# Patient Record
Sex: Female | Born: 1995 | Race: White | Hispanic: No | Marital: Single | Smoking: Current every day smoker
Health system: Southern US, Community
[De-identification: ages and names within clinical notes are randomized; demographics above are authoritative.]

## PROBLEM LIST (undated history)

## (undated) DIAGNOSIS — M545 Low back pain, unspecified: Secondary | ICD-10-CM

## (undated) DIAGNOSIS — F329 Major depressive disorder, single episode, unspecified: Secondary | ICD-10-CM

## (undated) DIAGNOSIS — F32A Depression, unspecified: Secondary | ICD-10-CM

## (undated) DIAGNOSIS — M4310 Spondylolisthesis, site unspecified: Secondary | ICD-10-CM

## (undated) DIAGNOSIS — M4317 Spondylolisthesis, lumbosacral region: Secondary | ICD-10-CM

## (undated) DIAGNOSIS — F419 Anxiety disorder, unspecified: Secondary | ICD-10-CM

## (undated) HISTORY — DX: Spondylolisthesis, lumbosacral region: M43.17

## (undated) HISTORY — PX: OTHER SURGICAL HISTORY: SHX169

## (undated) HISTORY — DX: Low back pain, unspecified: M54.50

---
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
## 2014-01-20 ENCOUNTER — Encounter (HOSPITAL_COMMUNITY): Payer: Self-pay | Admitting: Emergency Medicine

## 2014-01-20 ENCOUNTER — Emergency Department (HOSPITAL_COMMUNITY)
Admission: EM | Admit: 2014-01-20 | Discharge: 2014-01-20 | Disposition: A | Discharge status: Home / Self Care | Payer: BC Managed Care – PPO | Attending: Emergency Medicine | Admitting: Emergency Medicine

## 2014-01-20 DIAGNOSIS — F17200 Nicotine dependence, unspecified, uncomplicated: Secondary | ICD-10-CM | POA: Insufficient documentation

## 2014-01-20 DIAGNOSIS — Y9389 Activity, other specified: Secondary | ICD-10-CM | POA: Insufficient documentation

## 2014-01-20 DIAGNOSIS — T169XXA Foreign body in ear, unspecified ear, initial encounter: Secondary | ICD-10-CM | POA: Insufficient documentation

## 2014-01-20 DIAGNOSIS — Y929 Unspecified place or not applicable: Secondary | ICD-10-CM | POA: Insufficient documentation

## 2014-01-20 DIAGNOSIS — Z8659 Personal history of other mental and behavioral disorders: Secondary | ICD-10-CM | POA: Insufficient documentation

## 2014-01-20 DIAGNOSIS — IMO0002 Reserved for concepts with insufficient information to code with codable children: Secondary | ICD-10-CM | POA: Insufficient documentation

## 2014-01-20 HISTORY — DX: Anxiety disorder, unspecified: F41.9

## 2014-01-20 MED ORDER — ANTIPYRINE-BENZOCAINE 5.4-1.4 % OT SOLN: 3.0000 [drp] | OTIC | Status: DC | PRN

## 2014-01-20 NOTE — ED Notes (Signed)
Pt states she awoke just before arrival with crawling sensation in L ear, pt states she could feel movement with associated pain. Visual inspection of ear with otoscope revealed insect alive moving in ear canal.

## 2014-01-20 NOTE — ED Provider Notes (Signed)
CSN: [id number]     Arrival date & time 01/20/14  0509 History   First MD Initiated Contact with Patient 01/20/14 0510     Chief Complaint  Patient presents with  . Foreign Body in Ear     (Consider location/radiation/quality/duration/timing/severity/associated sxs/prior Treatment) HPI Comments: Pt comes in with cc of ear pain. Reports waking up in the middle of the night and noticing crawling sensation to the left ear. Pt examined by RN at triage, and she could see an insect in the ear canal. Pt's pain increased with the RN eval. No other complains. No drainage from the ear.  Patient is a 18 y.o. female presenting with foreign body in ear. The history is provided by the patient.  Foreign Body in Ear Pertinent negatives include no headaches.    Past Medical History  Diagnosis Date  . Anxiety    Past Surgical History  Procedure Laterality Date  . Wisdom     No family history on file. History  Substance Use Topics  . Smoking status: Current Some Day Smoker  . Smokeless tobacco: Not on file  . Alcohol Use: Yes     Comment: occ   OB History   Grav Para Term Preterm Abortions TAB SAB Ect Mult Living                 Review of Systems  HENT: Positive for ear pain. Negative for ear discharge.   Eyes: Negative for visual disturbance.  Gastrointestinal: Negative for nausea and vomiting.  Neurological: Negative for headaches.      Allergies  Review of patient's allergies indicates no known allergies.  Home Medications   Prior to Admission medications   Medication Sig Start Date End Date Taking? Authorizing Provider  antipyrine-benzocaine Lyla Son(AURALGAN) otic solution Place 3-4 drops into the left ear every 2 (two) hours as needed for ear pain. 01/20/14   Delani Kohli Rhunette CroftNanavati, MD   BP 125/64  Pulse 93  Temp(Src) 98.2 F (36.8 C) (Oral)  Resp 16  Ht 5\' 7"  (1.702 m)  Wt 211 lb (95.709 kg)  BMI 33.04 kg/m2  SpO2 99%  LMP 12/08/2013 Physical Exam  Nursing note and vitals  reviewed. Constitutional: She appears well-developed.  HENT:  Left ear canal is clear - no insect/foreign body seen  Eyes: Conjunctivae are normal.  Neck: Neck supple.  Cardiovascular: Normal rate.   Pulmonary/Chest: Effort normal.    ED Course  Irrigation Date/Time: 01/20/2014 7:08 AM Performed by: Derwood KaplanNANAVATI, Refugio Mcconico Authorized by: Derwood KaplanNANAVATI, Shadaya Marschner Consent: Verbal consent obtained. Risks and benefits: risks, benefits and alternatives were discussed Consent given by: patient Required items: required blood products, implants, devices, and special equipment available Patient identity confirmed: verbally with patient Time out: Immediately prior to procedure a "time out" was called to verify the correct patient, procedure, equipment, support staff and site/side marked as required. Preparation: Patient was prepped and draped in the usual sterile fashion. Local anesthesia used: no Patient sedated: no Patient tolerance: Patient tolerated the procedure well with no immediate complications. Comments: Left ear irrigation for foreign body removal   (including critical care time) Labs Review Labs Reviewed - No data to display  Imaging Review No results found.   EKG Interpretation None      MDM   Final diagnoses:  Ear foreign body    Pt comes to the ER with foreign body to the left ear. RN saw the insect, and promptly applied lidocaine to the ear. Patient's pain increased initially, but then the crawling sensation  resolved. Pt was pain free by the time i saw her - and i couldn't locate the insect. Irrigated several time with saline -with no good results.  Will d.c with auralgan for her pain.  Derwood KaplanAnkit Jakyah Bradby, MD 01/20/14 [phone]

## 2014-01-20 NOTE — ED Notes (Signed)
Pt provided suction for L ear, no results at this time

## 2014-01-20 NOTE — ED Notes (Signed)
Ear flushed with warmed lidocaine then warmed saline, pt no longer feels movement. Unable to visualize insect, nothing produced with flushing

## 2014-01-20 NOTE — Discharge Instructions (Signed)
Ear Foreign Body °An ear foreign body is an object that is stuck in the ear. It is common for young children to put objects into the ear canal. These may include pebbles, beads, beans, and any other small objects which will fit. In adults, objects such as cotton swabs may become lodged in the ear canal. In all ages, the most common foreign bodies are insects that enter the ear canal.  °SYMPTOMS  °Foreign bodies may cause pain, buzzing or roaring sounds, hearing loss, and ear drainage.  °HOME CARE INSTRUCTIONS  °· Keep all follow-up appointments with your caregiver as told. °· Keep small objects out of reach of young children. Tell them not to put anything in their ears. °SEEK IMMEDIATE MEDICAL CARE IF:  °· You have bleeding from the ear. °· You have increased pain or swelling of the ear. °· You have reduced hearing. °· You have discharge coming from the ear. °· You have a fever. °· You have a headache. °MAKE SURE YOU:  °· Understand these instructions. °· Will watch your condition. °· Will get help right away if you are not doing well or get worse. °Document Released: 07/24/2000 Document Revised: 10/19/2011 Document Reviewed: 03/14/2008 °ExitCare® Patient Information ©2014 [employer]. ° °

## 2016-05-07 ENCOUNTER — Ambulatory Visit (INDEPENDENT_AMBULATORY_CARE_PROVIDER_SITE_OTHER): Payer: BLUE CROSS/BLUE SHIELD | Admitting: Family Medicine

## 2016-05-07 ENCOUNTER — Ambulatory Visit (INDEPENDENT_AMBULATORY_CARE_PROVIDER_SITE_OTHER): Payer: BLUE CROSS/BLUE SHIELD

## 2016-05-07 VITALS — BP 118/70 | HR 81 | Temp 98.1°F | Resp 16 | Ht 67.25 in | Wt 232.6 lb

## 2016-05-07 DIAGNOSIS — R059 Cough, unspecified: Secondary | ICD-10-CM

## 2016-05-07 DIAGNOSIS — R05 Cough: Secondary | ICD-10-CM

## 2016-05-07 DIAGNOSIS — R509 Fever, unspecified: Secondary | ICD-10-CM

## 2016-05-07 DIAGNOSIS — J069 Acute upper respiratory infection, unspecified: Secondary | ICD-10-CM | POA: Diagnosis not present

## 2016-05-07 MED ORDER — GUAIFENESIN-CODEINE 200-10 MG/5ML PO LIQD: 5.0000 mL | Freq: Three times a day (TID) | ORAL | 0 refills | Status: DC | PRN

## 2016-05-07 MED ORDER — AZITHROMYCIN 250 MG PO TABS: ORAL_TABLET | ORAL | 0 refills | Status: DC

## 2016-05-07 MED ORDER — PREDNISONE 10 MG (21) PO TBPK: ORAL_TABLET | ORAL | 0 refills | Status: DC

## 2016-05-07 NOTE — Patient Instructions (Addendum)
  You also have an upper respiratory illness.   I have sent to azithromycin for this. You should take 2 pills today then 1 pill daily after that for 5 days total.  I have also sent a prednisone to help quiet down the inflammation/wheezing or lungs. Take 6 pills today, 5 pills tomorrow, 4 pills today after, 3 pills after, 2 pills after, 1 pill last day.  I have prescribed you a prescription cough syrup. This has codeine in it. This may make you sleepy see do not drive with it.  I did hear some wheezing at the bottoms of your lungs. You should come back in 6-8 weeks to be evaluated for pulmonary function testing for asthma.      IF you received an x-ray today, you will receive an invoice from [facility]. Please contact [facility] at [phone] with questions or concerns regarding your invoice.   IF you received labwork today, you will receive an invoice from [facility]. Please contact [facility] at [phone] with questions or concerns regarding your invoice.   Our billing staff will not be able to assist you with questions regarding bills from these companies.  You will be contacted with the lab results as soon as they are available. The fastest way to get your results is to activate your My Chart account. Instructions are located on the last page of this paperwork. If you have not heard from us regarding the results in 2 weeks, please contact this office.

## 2016-05-07 NOTE — Progress Notes (Signed)
Laura Cooper is a 20 y.o. female who presents to [facility] today for cough and URI symptoms:  1.  Cough:  Present for past 2 weeks.  Describes runny nose, alternating productive and dry hacking cough for same period of time.  Cough is now main symptom. She has had episodes of fevers with cold chills. Describes is his inability to get warm also "pouring sweat."  Sick contacts are her boyfriend. She has had a couple of episodes of posttussive emesis. These of just bile/mucus.  She is not feeling better since symptoms started 2 weeks ago -- if anything this is getting worse.   Family history: Denies any history of lung issues. States that her mother father and all siblings are healthy.  Past history: Significant for depression and anxiety. She also does have irregular menstrual cycles. These come every 2 months or so. Last menstrual period was August 7. She just took a pregnancy test this week which was negative. She has no personal history of asthma. However she states she was given an albuterol inhaler about a year ago for bronchitis. She does not have this anymore. She did not need to use this any further after her bronchitic episode.  Social:  She denies smoking cigarettes, though both mom and boyfriend smoke cigarettes.  Endorses daily THC smoking.    ROS as above.    PMH reviewed. Patient is a nonsmoker.   Past Medical History:  Diagnosis Date  . Anxiety    Past Surgical History:  Procedure Laterality Date  . wisdom      Medications reviewed. Current Outpatient Prescriptions  Medication Sig Dispense Refill  . antipyrine-benzocaine (AURALGAN) otic solution Place 3-4 drops into the left ear every 2 (two) hours as needed for ear pain. (Patient not taking: Reported on 05/07/2016) 10 mL 0   No current facility-administered medications for this visit.      Physical Exam:  BP 118/70 (BP Location: Right Arm, Patient Position: Sitting, Cuff Size: Normal)    Pulse 81   Temp 98.1 F (36.7 C) (Oral)   Resp 16   Ht 5' 7.25" (1.708 m)   Wt 232 lb 9.6 oz (105.5 kg)   LMP 03/14/2016 (Approximate)   SpO2 99%   BMI 36.16 kg/m  Gen:  Patient sitting on exam table, appears stated age in no acute distress.  Nontoxic though does appear not to feel well. Multiple coughing episodes.  Head: Normocephalic atraumatic Eyes: EOMI, PERRL, sclera and conjunctiva non-erythematous Ears:  Canals clear bilaterally.  TMs pearly gray bilaterally without erythema or bulging.   Nose:  Nasal turbinates grossly enlarged bilaterally. Some exudates noted. Tender to palpation of maxillary sinus  Mouth: Mucosa membranes moist. Tonsils +2, nonenlarged, non-erythematous. Neck: No cervical lymphadenopathy noted Heart:  RRR, no murmurs auscultated. Pulm:  Not great air Movement with this seems secondary to poor effort as she does not want to trigger coughing. When she is able to take a deep breath she does have persistent coughing episodes. When taking a deep breath I can hear wheezing only at the bilateral bases.   CXR:  Negative for any PNA.    Assessment and Plan:  1.  URI: - with beginnings of bronchitis. - no actual diagnosis of asthma.  Plan to treat with course of abx plus prednisone.  No need for inhaler as of now.  - Also Guaifenesin for relief of cough due to persistence and severity of symptoms.    2.  Wheezing: -  needs to return in 6 - 8 weeks for PFTs to evaluate for asthma.  Likely secondary to daily THC smoking and current illness.   3.  THC use: - counseled to quit.  Discussed how this smoke can still trigger/worsen lung issues.

## 2016-08-20 ENCOUNTER — Ambulatory Visit (INDEPENDENT_AMBULATORY_CARE_PROVIDER_SITE_OTHER): Payer: BLUE CROSS/BLUE SHIELD | Admitting: Physician Assistant

## 2016-08-20 VITALS — BP 108/60 | HR 95 | Temp 99.0°F | Resp 16 | Ht 67.25 in | Wt 234.8 lb

## 2016-08-20 DIAGNOSIS — R059 Cough, unspecified: Secondary | ICD-10-CM

## 2016-08-20 DIAGNOSIS — R112 Nausea with vomiting, unspecified: Secondary | ICD-10-CM | POA: Diagnosis not present

## 2016-08-20 DIAGNOSIS — R0981 Nasal congestion: Secondary | ICD-10-CM

## 2016-08-20 DIAGNOSIS — B349 Viral infection, unspecified: Secondary | ICD-10-CM

## 2016-08-20 DIAGNOSIS — R05 Cough: Secondary | ICD-10-CM | POA: Diagnosis not present

## 2016-08-20 MED ORDER — FLUTICASONE PROPIONATE 50 MCG/ACT NA SUSP: 2.0000 | Freq: Every day | NASAL | 6 refills | Status: DC

## 2016-08-20 MED ORDER — BENZONATATE 100 MG PO CAPS: 100.0000 mg | ORAL_CAPSULE | Freq: Three times a day (TID) | ORAL | 0 refills | Status: DC | PRN

## 2016-08-20 MED ORDER — HYDROCODONE-HOMATROPINE 5-1.5 MG/5ML PO SYRP: 5.0000 mL | ORAL_SOLUTION | Freq: Three times a day (TID) | ORAL | 0 refills | Status: DC | PRN

## 2016-08-20 MED ORDER — MUCINEX DM MAXIMUM STRENGTH 60-1200 MG PO TB12: 1.0000 | ORAL_TABLET | Freq: Two times a day (BID) | ORAL | 1 refills | Status: DC

## 2016-08-20 NOTE — Patient Instructions (Addendum)
Drink at least 2 liters of water a day. Warm tea with honey. Hot showers.  Flonase: 2 sprays each nostril at night before bed and in the morning when you wake up.  Hycodan - please only take this as directed and at night before bed.    Bland Diet Introduction A bland diet consists of foods that do not have a lot of fat or fiber. Foods without fat or fiber are easier for the body to digest. They are also less likely to irritate your mouth, throat, stomach, and other parts of your gastrointestinal tract. A bland diet is sometimes called a BRAT diet. What is my plan? Your health care provider or dietitian may recommend specific changes to your diet to prevent and treat your symptoms, such as:  Eating small meals often.  Cooking food until it is soft enough to chew easily.  Chewing your food well.  Drinking fluids slowly.  Not eating foods that are very spicy, sour, or fatty.  Not eating citrus fruits, such as oranges and grapefruit. What do I need to know about this diet?  Eat a variety of foods from the bland diet food list.  Do not follow a bland diet longer than you have to.  Ask your health care provider whether you should take vitamins. What foods can I eat? Grains  Hot cereals, such as cream of wheat. Bread, crackers, or tortillas made from refined white flour. Rice. Vegetables  Canned or cooked vegetables. Mashed or boiled potatoes. Fruits  Bananas. Applesauce. Other types of cooked or canned fruit with the skin and seeds removed, such as canned peaches or pears. Meats and Other Protein Sources  Scrambled eggs. Creamy peanut butter or other nut butters. Lean, well-cooked meats, such as chicken or fish. Tofu. Soups or broths. Dairy  Low-fat dairy products, such as milk, cottage cheese, or yogurt. Beverages  Water. Herbal tea. Apple juice. Sweets and Desserts  Pudding. Custard. Fruit gelatin. Ice cream. Fats and Oils  Mild salad dressings. Canola or olive oil. The  items listed above may not be a complete list of allowed foods or beverages. Contact your dietitian for more options.  What foods are not recommended? Foods and ingredients that are often not recommended include:  Spicy foods, such as hot sauce or salsa.  Fried foods.  Sour foods, such as pickled or fermented foods.  Raw vegetables or fruits, especially citrus or berries.  Caffeinated drinks.  Alcohol.  Strongly flavored seasonings or condiments. The items listed above may not be a complete list of foods and beverages that are not allowed. Contact your dietitian for more information.  This information is not intended to replace advice given to you by your health care provider. Make sure you discuss any questions you have with your health care provider. Document Released: 11/18/2015 Document Revised: 01/02/2016 Document Reviewed: 08/08/2014  2017 Elsevier  IF you received an x-ray today, you will receive an invoice from [facility]. Please contact [facility] at [phone] with questions or concerns regarding your invoice.   IF you received labwork today, you will receive an invoice from [facility]. Please contact [facility] at [phone] with questions or concerns regarding your invoice.   Our billing staff will not be able to assist you with questions regarding bills from these companies.  You will be contacted with the lab results as soon as they are available. The fastest way to get your results is to activate your My Chart account. Instructions are located on the last page  of this paperwork. If you have not heard from [place] regarding the results in 2 weeks, please contact this office.

## 2016-08-20 NOTE — Progress Notes (Signed)
   Laura Cooper  MRN: [id number] DOB: 09-05-1995  PCP: No primary care provider on file.  Subjective:  Pt is a 21 year old female who presents to clinic for cough, headache and vomiting.  Cough and "stopped up nose" x five days. Cough "has kind of subsided", still some coughing fits. This morning she was very nauseous and vomited 3 times this morning. +sweating, migraine, sore throat.  Dull headache.  Nose running. Congestion is worse in the morning. +"very phlegmie"   +birth control. LMP three days ago.  Took DayQuil "severe cold and flu. Denies chills, chest pain, palpitations, blurry vision, diarrhea.   Review of Systems  Constitutional: Negative for chills, diaphoresis, fatigue and fever.  HENT: Positive for postnasal drip and rhinorrhea. Negative for congestion, sinus pressure, sneezing and sore throat.   Respiratory: Positive for cough. Negative for chest tightness, shortness of breath and wheezing.   Cardiovascular: Negative for chest pain and palpitations.  Gastrointestinal: Positive for vomiting. Negative for abdominal pain, diarrhea and nausea.  Neurological: Positive for headaches. Negative for weakness and light-headedness.  Psychiatric/Behavioral: Positive for sleep disturbance.    There are no active problems to display for this patient.   No current outpatient prescriptions on file prior to visit.   No current facility-administered medications on file prior to visit.     No Known Allergies   Objective:  BP 108/60   Pulse 95   Temp 99 F (37.2 C) (Oral)   Resp 16   Ht 5' 7.25" (1.708 m)   Wt 234 lb 12.8 oz (106.5 kg)   LMP 08/10/2016   SpO2 96%   BMI 36.50 kg/m   Physical Exam  Constitutional: She is oriented to person, place, and time and well-developed, well-nourished, and in no distress. No distress.  HENT:  Right Ear: Tympanic membrane normal.  Left Ear: Tympanic membrane normal.  Nose: Mucosal edema and rhinorrhea present. Right sinus  exhibits no maxillary sinus tenderness and no frontal sinus tenderness. Left sinus exhibits no maxillary sinus tenderness and no frontal sinus tenderness.  Mouth/Throat: Mucous membranes are normal. Posterior oropharyngeal edema present. No oropharyngeal exudate or posterior oropharyngeal erythema.  Cardiovascular: Normal rate, regular rhythm and normal heart sounds.   Pulmonary/Chest: Effort normal and breath sounds normal. No respiratory distress.  Neurological: She is alert and oriented to person, place, and time. GCS score is 15.  Skin: Skin is warm and dry.  Psychiatric: Mood, memory, affect and judgment normal.  Vitals reviewed.   Assessment and Plan :  1. Viral illness 2. Nasal congestion 3. Cough 4. Nausea and vomiting, intractability of vomiting not specified, unspecified vomiting type - Dextromethorphan-Guaifenesin (MUCINEX DM MAXIMUM STRENGTH) 60-1200 MG TB12; Take 1 tablet by mouth every 12 (twelve) hours.  Dispense: 14 each; Refill: 1 - fluticasone (FLONASE) 50 MCG/ACT nasal spray; Place 2 sprays into both nostrils daily.  Dispense: 16 g; Refill: 6 - benzonatate (TESSALON) 100 MG capsule; Take 1-2 capsules (100-200 mg total) by mouth 3 (three) times daily as needed for cough.  Dispense: 40 capsule; Refill: 0 - HYDROcodone-homatropine (HYCODAN) 5-1.5 MG/5ML syrup; Take 5 mLs by mouth every 8 (eight) hours as needed for cough.  Dispense: 120 mL; Refill: 0 - Supportive care: Push fluids, rest, hot showers, warm tea with honey, bland diet. RTC in 5-7 days if no improvement.    Marco CollieWhitney Tonya Wantz, PA-C  [facility] 08/20/2016 9:18 AM

## 2018-04-25 ENCOUNTER — Ambulatory Visit (INDEPENDENT_AMBULATORY_CARE_PROVIDER_SITE_OTHER): Payer: BLUE CROSS/BLUE SHIELD | Admitting: Family Medicine

## 2018-04-25 ENCOUNTER — Encounter: Payer: Self-pay | Admitting: Family Medicine

## 2018-04-25 ENCOUNTER — Other Ambulatory Visit: Payer: Self-pay

## 2018-04-25 ENCOUNTER — Ambulatory Visit (INDEPENDENT_AMBULATORY_CARE_PROVIDER_SITE_OTHER): Payer: BLUE CROSS/BLUE SHIELD

## 2018-04-25 VITALS — BP 115/76 | HR 90 | Temp 99.4°F | Resp 16 | Ht 67.25 in | Wt 236.6 lb

## 2018-04-25 DIAGNOSIS — M4317 Spondylolisthesis, lumbosacral region: Secondary | ICD-10-CM

## 2018-04-25 DIAGNOSIS — M545 Low back pain, unspecified: Secondary | ICD-10-CM

## 2018-04-25 MED ORDER — MELOXICAM 7.5 MG PO TABS: 7.5000 mg | ORAL_TABLET | Freq: Every day | ORAL | 0 refills | Status: DC

## 2018-04-25 MED ORDER — CYCLOBENZAPRINE HCL 5 MG PO TABS: ORAL_TABLET | ORAL | 0 refills | Status: DC

## 2018-04-25 NOTE — Patient Instructions (Addendum)
Current symptoms appear to be due to a flare of previous back pain, but based on some areas seen on your xray I will refer you to orthopaedic back specialist.  For current symptoms would recommend meloxicam once per day.  Do not combine with other NSAIDs such as ibuprofen or Aleve.  Flexeril up to every 8 hours but start with that at bedtime due to sedation.  Heat or ice and gentle range of motion as tolerated.  See other information below. Avoid heavy lifting for now.   Return to the clinic or go to the nearest emergency room if any of your symptoms worsen or new symptoms occur.  Spondylolisthesis Spondylolisthesis is when one of the bones in the spine (vertebrae) slips forward and out of place. This most commonly occurs in the lower back (lumbar spine), but it can happen anywhere along the spine. A vertebra may move out of place due to a previous back injury. In some cases, the injury may go unnoticed until the vertebra moves out of place later in life. Spondylolisthesis may also be caused by an injury or a condition that affects the bones. What are the causes? This condition may be caused by:  Injury (trauma). This is often a result of doing sports or physical activities that: ? Put a lot of strain on the bones in the lower back. ? Involve repetitive overstretching (hyperextension) of the spine.  A condition that affects the bones, such as osteoarthritis or cancer.  Changes in the spine that happen due to age-related wear and tear.  What increases the risk? The following factors may make you more likely to develop this condition:  Participating in sports or activities that put a lot of strain on the lower back, including: ? Gymnastics. ? Figure skating. ? Weight lifting. ? Football.  Having a condition that affects the bones.  Being older than age 44.  What are the signs or symptoms? Symptoms of this condition may include:  Mild to severe pain in the legs, lower back, or  buttocks.  An abnormal way of walking (abnormal gait).  Poor posture.  Muscle stiffness, specifically in the hamstrings. The hamstrings are in the backs of the thighs.  Weakness, numbness, or a tingling sensation in the legs.  Symptoms may get worse when standing, and they may temporarily get better when sitting down or bending forward. In some cases, there may be no symptoms of this condition. How is this diagnosed?  This condition may be diagnosed based on:  Your symptoms.  Your medical history.  A physical exam. ? Your health care provider may push on certain areas to determine the source of your pain. ? You may be asked to bend forward, backward, and side to side so your health care provider can assess the severity of your pain and your range of motion.  Imaging tests, such as: ? X-rays. ? CT scan. ? MRI.  How is this treated? Treatment for this condition may include:  Resting. This may involve avoiding or modifying activities that put strain on your back until your symptoms improve.  Medicines to help relieve pain.  NSAIDs to help reduce swelling and discomfort.  Injections of medicine (cortisone) in your back. These injections can to help relieve pain and numbness.  A brace to stabilize and support your back.  Physical therapy. This may include working with an occupational therapist or physical therapist who can teach you how to reduce pressure on your back while you do everyday activities.  Surgery. This may be needed if: ? Other treatment methods do not improve your condition. ? Your symptoms do not go away after 3-6 months. ? You are unable to walk or stand. ? You have severe pain.  Follow these instructions at home: If you have a brace:  Wear it as told by your health care provider. Remove it only as told by your health care provider.  Do not let your brace get wet if it is not waterproof.  Keep the brace clean. Driving  Do not drive or operate  heavy machinery until you know how your pain medicine affects you.  Ask your health care provider when it is safe to drive if you have a back brace. Activity  Rest and return to your normal activities as told by your health care provider. Ask your health care provider what activities are safe for you.  Avoid activities that take a lot of effort (are strenuous) for as long as told by your health care provider.  Do exercises as told by your health care provider. General instructions  Take over-the-counter and prescription medicines only as told by your health care provider.  If you have questions or concerns about safety while taking pain medicine, talk with your health care provider.  Do not use any tobacco products, such as cigarettes, chewing tobacco, and e-cigarettes. Tobacco can delay bone healing. If you need help quitting, ask your health care provider.  Keep all follow-up visits as told by your health care provider. This is important. How is this prevented?  Warm up and stretch before being active.  Cool down and stretch after being active.  Give your body time to rest between periods of activity.  Make sure to use equipment that fits you.  Be safe and responsible while being active to avoid falls.  Do at least 150 minutes of moderate-intensity exercise each week, such as brisk walking or water aerobics.  Maintain physical fitness, including: ? Strength. ? Flexibility. ? Cardiovascular fitness. ? Endurance. Contact a health care provider if:  You have pain that gets worse or does not get better. Get help right away if:  You have severe back pain.  You develop weakness or numbness in your legs.  You are unable to stand or walk. This information is not intended to replace advice given to you by your health care provider. Make sure you discuss any questions you have with your health care provider. Document Released: 07/27/2005 Document Revised: 04/02/2016 Document  Reviewed: 05/07/2015 Elsevier Interactive Patient Education  2018 [employer]   Back Pain, Adult Many adults have back pain from time to time. Common causes of back pain include:  A strained muscle or ligament.  Wear and tear (degeneration) of the spinal disks.  Arthritis.  A hit to the back.  Back pain can be short-lived (acute) or last a long time (chronic). A physical exam, lab tests, and imaging studies may be done to find the cause of your pain. Follow these instructions at home: Managing pain and stiffness  Take over-the-counter and prescription medicines only as told by your health care provider.  If directed, apply heat to the affected area as often as told by your health care provider. Use the heat source that your health care provider recommends, such as a moist heat pack or a heating pad. ? Place a towel between your skin and the heat source. ? Leave the heat on for 20-30 minutes. ? Remove the heat if your skin turns bright  red. This is especially important if you are unable to feel pain, heat, or cold. You have a greater risk of getting burned.  If directed, apply ice to the injured area: ? Put ice in a plastic bag. ? Place a towel between your skin and the bag. ? Leave the ice on for 20 minutes, 2-3 times a day for the first 2-3 days. Activity  Do not stay in bed. Resting more than 1-2 days can delay your recovery.  Take short walks on even surfaces as soon as you are able. Try to increase the length of time you walk each day.  Do not sit, drive, or stand in one place for more than 30 minutes at a time. Sitting or standing for long periods of time can put stress on your back.  Use proper lifting techniques. When you bend and lift, use positions that put less stress on your back: ? [place] your knees. ? Keep the load close to your body. ? Avoid twisting.  Exercise regularly as told by your health care provider. Exercising will help your back heal faster. This  also helps prevent back injuries by keeping muscles strong and flexible.  Your health care provider may recommend that you see a physical therapist. This person can help you come up with a safe exercise program. Do any exercises as told by your physical therapist. Lifestyle  Maintain a healthy weight. Extra weight puts stress on your back and makes it difficult to have good posture.  Avoid activities or situations that make you feel anxious or stressed. Learn ways to manage anxiety and stress. One way to manage stress is through exercise. Stress and anxiety increase muscle tension and can make back pain worse. General instructions  Sleep on a firm mattress in a comfortable position. Try lying on your side with your knees slightly bent. If you lie on your back, put a pillow under your knees.  Follow your treatment plan as told by your health care provider. This may include: ? Cognitive or behavioral therapy. ? Acupuncture or massage therapy. ? Meditation or yoga. Contact a health care provider if:  You have pain that is not relieved with rest or medicine.  You have increasing pain going down into your legs or buttocks.  Your pain does not improve in 2 weeks.  You have pain at night.  You lose weight.  You have a fever or chills. Get help right away if:  You develop new bowel or bladder control problems.  You have unusual weakness or numbness in your arms or legs.  You develop nausea or vomiting.  You develop abdominal pain.  You feel faint. Summary  Many adults have back pain from time to time. A physical exam, lab tests, and imaging studies may be done to find the cause of your pain.  Use proper lifting techniques. When you bend and lift, use positions that put less stress on your back.  Take over-the-counter and prescription medicines and apply heat or ice as directed by your health care provider. This information is not intended to replace advice given to you by your  health care provider. Make sure you discuss any questions you have with your health care provider. Document Released: 07/27/2005 Document Revised: 08/31/2016 Document Reviewed: 08/31/2016 Elsevier Interactive Patient Education  [employer].    If you have lab work done today you will be contacted with your lab results within the next 2 weeks.  If you have not heard from [place]  then please contact us. The fastest way to get your results is to register for My Chart.   IF you received an x-ray today, you will receive an invoice from [facility]. Please contact [facility] at [phone] with questions or concerns regarding your invoice.   IF you received labwork today, you will receive an invoice from [facility]. Please contact [facility] at [phone] with questions or concerns regarding your invoice.   Our billing staff will not be able to assist you with questions regarding bills from these companies.  You will be contacted with the lab results as soon as they are available. The fastest way to get your results is to activate your My Chart account. Instructions are located on the last page of this paperwork. If you have not heard from [place] regarding the results in 2 weeks, please contact this office.

## 2018-04-25 NOTE — Progress Notes (Signed)
Subjective:  By signing my name below, I, [name], attest that this documentation has been prepared under the direction and in the presence of Shade FloodJeffrey R Siobahn Worsley, MD Electronically Signed: Charline BillsEssence Howell, ED Scribe 04/25/2018 at 2:25 PM.   Patient ID: Laura Cooper, female    DOB: [birth year], 22 y.o.   MRN: [id number]  Chief Complaint  Patient presents with  . Back Pain    since 04/21/18, constant, dull until she moves and it becomes a shooting pain, any position its achy.  Pain level 6/10, took ibuprofen for pain this morning with no relief.   Using icy hot and ice with no relief.  Hx of low back pain since [place]  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 female who [facility] to [facility] at [facility] complaining of acute on chronic low back pain x 4-5 days. Pt states that she has had flares of back pain for 5-6 yrs that typically improves in 1-2 days with icing her back, however, this flare has lasted longer. Reports flares a few times/yr. She reports a constant dull, aching sensation that she rates 6/10; shooting and catching sensation in her low back with rotation. She does report standing for sev hours on concrete floors at work which she thinks has exacerbated back pain. Pt has tried [employer] Hot, ice, ibuprofen and Advil liquid gels without sig relief. Denies radiating pain into LEs, bladder/bowel incontinence, saddle anesthesia, weakness in LEs, difficulty urinating, dysuria, vaginal bleeding, missed doses of depo. Pt does report family h/o back pain. She has never had PT for back pain.  There are no active problems to display for this patient.  Past Medical History:  Diagnosis Date  . Anxiety    Past Surgical History:  Procedure Laterality Date  . wisdom     No Known Allergies Prior to Admission medications   Medication Sig Start Date End Date Taking? Authorizing Provider  benzonatate  (TESSALON) 100 MG capsule Take 1-2 capsules (100-200 mg total) by mouth 3 (three) times daily as needed for cough. 08/20/16   McVey, Madelaine BhatElizabeth Whitney, PA-C  Dextromethorphan-Guaifenesin (MUCINEX DM MAXIMUM STRENGTH) 60-1200 MG TB12 Take 1 tablet by mouth every 12 (twelve) hours. 08/20/16   McVey, Madelaine BhatElizabeth Whitney, PA-C  fluticasone (FLONASE) 50 MCG/ACT nasal spray Place 2 sprays into both nostrils daily. 08/20/16   McVey, Madelaine BhatElizabeth Whitney, PA-C  HYDROcodone-homatropine (HYCODAN) 5-1.5 MG/5ML syrup Take 5 mLs by mouth every 8 (eight) hours as needed for cough. 08/20/16   McVey, Madelaine BhatElizabeth Whitney, PA-C  PRESCRIPTION MEDICATION Birth control pill    [provider]   Social History   Socioeconomic History  . Marital status: Single    Spouse name: Not on file  . Number of children: Not on file  . Years of education: Not on file  . Highest education level: Not on file  Occupational History  . Not on file  Social Needs  . Financial resource strain: Not on file  . Food insecurity:    Worry: Not on file    Inability: Not on file  . Transportation needs:    Medical: Not on file    Non-medical: Not on file  Tobacco Use  . Smoking status: Current Some Day Smoker  . Smokeless tobacco: Never Used  Substance and Sexual Activity  . Alcohol use: Yes    Comment: occ  . Drug use:  Yes    Types: Marijuana  . Sexual activity: Not on file  Lifestyle  . Physical activity:    Days per week: Not on file    Minutes per session: Not on file  . Stress: Not on file  Relationships  . Social connections:    Talks on phone: Not on file    Gets together: Not on file    Attends religious service: Not on file    Active member of club or organization: Not on file    Attends meetings of clubs or organizations: Not on file    Relationship status: Not on file  . Intimate partner violence:    Fear of current or ex partner: Not on file    Emotionally abused: Not on file    Physically abused: Not on  file    Forced sexual activity: Not on file  Other Topics Concern  . Not on file  Social History Narrative  . Not on file   Review of Systems  Genitourinary: Negative for difficulty urinating, dysuria and vaginal bleeding.  Musculoskeletal: Positive for back pain.  Neurological: Negative for weakness and numbness.      Objective:   Physical Exam  Constitutional: She is oriented to person, place, and time. She appears well-developed and well-nourished. No distress.  HENT:  Head: Normocephalic and atraumatic.  Eyes: Conjunctivae and EOM are normal.  Neck: Neck supple. No tracheal deviation present.  Cardiovascular: Normal rate.  Pulmonary/Chest: Effort normal. No respiratory distress.  Musculoskeletal:  Flexion 45 degrees. Lateral flexion minimal 15-20 degrees. Catching sensation with R rotation > L. Able to heel and toe walk without difficulty. Tender along lower paraspinals diffusely. Neg seated straight leg raise.  Neurological: She is alert and oriented to person, place, and time.  Skin: Skin is warm and dry.  Psychiatric: She has a normal mood and affect. Her behavior is normal.  Nursing note and vitals reviewed.  Vitals:   04/25/18 1354  BP: 115/76  Pulse: 90  Resp: 16  Temp: 99.4 F (37.4 C)  TempSrc: Oral  SpO2: 98%  Weight: 236 lb 9.6 oz (107.3 kg)  Height: 5' 7.25" (1.708 m)   Dg Lumbar Spine Complete  Result Date: 04/25/2018 CLINICAL DATA:  Lumbago, chronic EXAM: LUMBAR SPINE - COMPLETE 4+ VIEW COMPARISON:  None. FINDINGS: Frontal, lateral, spot lumbosacral lateral, and bilateral oblique views were obtained. There are 5 non-rib-bearing lumbar type vertebral bodies. There is no evident fracture. There are pars defects at L5 bilaterally with 1.0 cm of anterolisthesis of L5 on S1. No other spondylolisthesis is evident. There is disc space narrowing at L4-5 and L5-S1. Other disc spaces appear unremarkable. There is facet osteoarthritic change at L4-5 and L5-S1  bilaterally. IMPRESSION: Pars defects at L5 bilaterally with 1.0 cm of anterolisthesis of L5 on S1. No other spondylolisthesis. No fracture. 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 Electronically Signed   By: Bretta Bang III M.D.   On: 04/25/2018 14:54      Assessment & Plan:    WENONAH MILO is a 22 y.o. female Acute low back pain, unspecified back pain laterality, with sciatica presence unspecified - Plan: DG Lumbar Spine Complete, cyclobenzaprine (FLEXERIL) 5 MG tablet, meloxicam (MOBIC) 7.5 MG tablet, Ambulatory referral to Spine Surgery, CANCELED: Ambulatory referral to Physical Therapy  Recurrent low back pain - Plan: DG Lumbar Spine Complete, Ambulatory referral to Spine Surgery, CANCELED: Ambulatory referral to Physical Therapy  Spondylolisthesis at L5-S1 level - Plan: Ambulatory referral to  Spine Surgery  Recurrent low back pain, current symptoms more persistent but no red flags on exam or history.  Spondylolisthesis at L5-S1 noted as above.    -Flexeril, Mobic for acute treatment, potential side effects discussed  -Refer to back specialist to determine if advanced imaging needed for spondylolisthesis versus physical therapy.  -Lifting restrictions discussed for now, RTC precautions.  Meds ordered this encounter  Medications  . cyclobenzaprine (FLEXERIL) 5 MG tablet    Sig: 1 pill by mouth up to every 8 hours as needed. Start with one pill by mouth each bedtime as needed due to sedation    Dispense:  15 tablet    Refill:  0  . meloxicam (MOBIC) 7.5 MG tablet    Sig: Take 1 tablet (7.5 mg total) by mouth daily.    Dispense:  30 tablet    Refill:  0   Patient Instructions   Current symptoms appear to be due to a flare of previous back pain, but based on some areas seen on your xray I will refer you to orthopaedic back specialist.  For current symptoms would recommend meloxicam once per day.  Do not combine with other NSAIDs such as ibuprofen or Aleve.   Flexeril up to every 8 hours but start with that at bedtime due to sedation.  Heat or ice and gentle range of motion as tolerated.  See other information below. Avoid heavy lifting for now.   Return to the clinic or go to the nearest emergency room if any of your symptoms worsen or new symptoms occur.  Spondylolisthesis Spondylolisthesis is when one of the bones in the spine (vertebrae) slips forward and out of place. This most commonly occurs in the lower back (lumbar spine), but it can happen anywhere along the spine. A vertebra may move out of place due to a previous back injury. In some cases, the injury may go unnoticed until the vertebra moves out of place later in life. Spondylolisthesis may also be caused by an injury or a condition that affects the bones. What are the causes? This condition may be caused by:  Injury (trauma). This is often a result of doing sports or physical activities that: ? Put a lot of strain on the bones in the lower back. ? Involve repetitive overstretching (hyperextension) of the spine.  A condition that affects the bones, such as osteoarthritis or cancer.  Changes in the spine that happen due to age-related wear and tear.  What increases the risk? The following factors may make you more likely to develop this condition:  Participating in sports or activities that put a lot of strain on the lower back, including: ? Gymnastics. ? Figure skating. ? Weight lifting. ? Football.  Having a condition that affects the bones.  Being older than age 52.  What are the signs or symptoms? Symptoms of this condition may include:  Mild to severe pain in the legs, lower back, or buttocks.  An abnormal way of walking (abnormal gait).  Poor posture.  Muscle stiffness, specifically in the hamstrings. The hamstrings are in the backs of the thighs.  Weakness, numbness, or a tingling sensation in the legs.  Symptoms may get worse when standing, and they may  temporarily get better when sitting down or bending forward. In some cases, there may be no symptoms of this condition. How is this diagnosed?  This condition may be diagnosed based on:  Your symptoms.  Your medical history.  A physical exam. ? Your health  care provider may push on certain areas to determine the source of your pain. ? You may be asked to bend forward, backward, and side to side so your health care provider can assess the severity of your pain and your range of motion.  Imaging tests, such as: ? X-rays. ? CT scan. ? MRI.  How is this treated? Treatment for this condition may include:  Resting. This may involve avoiding or modifying activities that put strain on your back until your symptoms improve.  Medicines to help relieve pain.  NSAIDs to help reduce swelling and discomfort.  Injections of medicine (cortisone) in your back. These injections can to help relieve pain and numbness.  A brace to stabilize and support your back.  Physical therapy. This may include working with an occupational therapist or physical therapist who can teach you how to reduce pressure on your back while you do everyday activities.  Surgery. This may be needed if: ? Other treatment methods do not improve your condition. ? Your symptoms do not go away after 3-6 months. ? You are unable to walk or stand. ? You have severe pain.  Follow these instructions at home: If you have a brace:  Wear it as told by your health care provider. Remove it only as told by your health care provider.  Do not let your brace get wet if it is not waterproof.  Keep the brace clean. Driving  Do not drive or operate heavy machinery until you know how your pain medicine affects you.  Ask your health care provider when it is safe to drive if you have a back brace. Activity  Rest and return to your normal activities as told by your health care provider. Ask your health care provider what activities  are safe for you.  Avoid activities that take a lot of effort (are strenuous) for as long as told by your health care provider.  Do exercises as told by your health care provider. General instructions  Take over-the-counter and prescription medicines only as told by your health care provider.  If you have questions or concerns about safety while taking pain medicine, talk with your health care provider.  Do not use any tobacco products, such as cigarettes, chewing tobacco, and e-cigarettes. Tobacco can delay bone healing. If you need help quitting, ask your health care provider.  Keep all follow-up visits as told by your health care provider. This is important. How is this prevented?  Warm up and stretch before being active.  Cool down and stretch after being active.  Give your body time to rest between periods of activity.  Make sure to use equipment that fits you.  Be safe and responsible while being active to avoid falls.  Do at least 150 minutes of moderate-intensity exercise each week, such as brisk walking or water aerobics.  Maintain physical fitness, including: ? Strength. ? Flexibility. ? Cardiovascular fitness. ? Endurance. Contact a health care provider if:  You have pain that gets worse or does not get better. Get help right away if:  You have severe back pain.  You develop weakness or numbness in your legs.  You are unable to stand or walk. This information is not intended to replace advice given to you by your health care provider. Make sure you discuss any questions you have with your health care provider. Document Released: 07/27/2005 Document Revised: 04/02/2016 Document Reviewed: 05/07/2015 Elsevier Interactive Patient Education  2018 [employer].   Back Pain, Adult Many adults  have back pain from time to time. Common causes of back pain include:  A strained muscle or ligament.  Wear and tear (degeneration) of the spinal  disks.  Arthritis.  A hit to the back.  Back pain can be short-lived (acute) or last a long time (chronic). A physical exam, lab tests, and imaging studies may be done to find the cause of your pain. Follow these instructions at home: Managing pain and stiffness  Take over-the-counter and prescription medicines only as told by your health care provider.  If directed, apply heat to the affected area as often as told by your health care provider. Use the heat source that your health care provider recommends, such as a moist heat pack or a heating pad. ? Place a towel between your skin and the heat source. ? Leave the heat on for 20-30 minutes. ? Remove the heat if your skin turns bright red. This is especially important if you are unable to feel pain, heat, or cold. You have a greater risk of getting burned.  If directed, apply ice to the injured area: ? Put ice in a plastic bag. ? Place a towel between your skin and the bag. ? Leave the ice on for 20 minutes, 2-3 times a day for the first 2-3 days. Activity  Do not stay in bed. Resting more than 1-2 days can delay your recovery.  Take short walks on even surfaces as soon as you are able. Try to increase the length of time you walk each day.  Do not sit, drive, or stand in one place for more than 30 minutes at a time. Sitting or standing for long periods of time can put stress on your back.  Use proper lifting techniques. When you bend and lift, use positions that put less stress on your back: ? [place] your knees. ? Keep the load close to your body. ? Avoid twisting.  Exercise regularly as told by your health care provider. Exercising will help your back heal faster. This also helps prevent back injuries by keeping muscles strong and flexible.  Your health care provider may recommend that you see a physical therapist. This person can help you come up with a safe exercise program. Do any exercises as told by your physical  therapist. Lifestyle  Maintain a healthy weight. Extra weight puts stress on your back and makes it difficult to have good posture.  Avoid activities or situations that make you feel anxious or stressed. Learn ways to manage anxiety and stress. One way to manage stress is through exercise. Stress and anxiety increase muscle tension and can make back pain worse. General instructions  Sleep on a firm mattress in a comfortable position. Try lying on your side with your knees slightly bent. If you lie on your back, put a pillow under your knees.  Follow your treatment plan as told by your health care provider. This may include: ? Cognitive or behavioral therapy. ? Acupuncture or massage therapy. ? Meditation or yoga. Contact a health care provider if:  You have pain that is not relieved with rest or medicine.  You have increasing pain going down into your legs or buttocks.  Your pain does not improve in 2 weeks.  You have pain at night.  You lose weight.  You have a fever or chills. Get help right away if:  You develop new bowel or bladder control problems.  You have unusual weakness or numbness in your arms or legs.  You  develop nausea or vomiting.  You develop abdominal pain.  You feel faint. Summary  Many adults have back pain from time to time. A physical exam, lab tests, and imaging studies may be done to find the cause of your pain.  Use proper lifting techniques. When you bend and lift, use positions that put less stress on your back.  Take over-the-counter and prescription medicines and apply heat or ice as directed by your health care provider. This information is not intended to replace advice given to you by your health care provider. Make sure you discuss any questions you have with your health care provider. Document Released: 07/27/2005 Document Revised: 08/31/2016 Document Reviewed: 08/31/2016 Elsevier Interactive Patient Education  [employer].    If you have lab work done today you will be contacted with your lab results within the next 2 weeks.  If you have not heard from [place] then please contact us. The fastest way to get your results is to register for My Chart.   IF you received an x-ray today, you will receive an invoice from [facility]. Please contact [facility] at [phone] with questions or concerns regarding your invoice.   IF you received labwork today, you will receive an invoice from [place]. Please contact [facility] at [phone] with questions or concerns regarding your invoice.   Our billing staff will not be able to assist you with questions regarding bills from these companies.  You will be contacted with the lab results as soon as they are available. The fastest way to get your results is to activate your My Chart account. Instructions are located on the last page of this paperwork. If you have not heard from [place] regarding the results in 2 weeks, please contact this office.      I personally performed the services described in this documentation, which was scribed in my presence. The recorded information has been reviewed and considered for accuracy and completeness, addended by me as needed, and agree with information above.  Signed,   Meredith Staggers, MD Primary Care at [facility].  04/26/18 4:30 PM

## 2018-05-06 ENCOUNTER — Other Ambulatory Visit: Payer: Self-pay | Admitting: Orthopedic Surgery

## 2018-05-06 DIAGNOSIS — M545 Low back pain, unspecified: Secondary | ICD-10-CM

## 2018-05-14 ENCOUNTER — Ambulatory Visit
Admission: RE | Admit: 2018-05-14 | Discharge: 2018-05-14 | Disposition: A | Discharge status: Home / Self Care | Payer: BLUE CROSS/BLUE SHIELD | Source: Ambulatory Visit | Attending: Orthopedic Surgery | Admitting: Orthopedic Surgery

## 2018-05-14 DIAGNOSIS — M545 Low back pain, unspecified: Secondary | ICD-10-CM

## 2019-01-10 ENCOUNTER — Emergency Department (HOSPITAL_COMMUNITY): Payer: BC Managed Care – PPO

## 2019-01-10 ENCOUNTER — Emergency Department (HOSPITAL_COMMUNITY)
Admission: EM | Admit: 2019-01-10 | Discharge: 2019-01-10 | Disposition: A | Discharge status: Home / Self Care | Payer: BC Managed Care – PPO | Attending: Emergency Medicine | Admitting: Emergency Medicine

## 2019-01-10 ENCOUNTER — Encounter (HOSPITAL_COMMUNITY): Payer: Self-pay | Admitting: Family Medicine

## 2019-01-10 ENCOUNTER — Other Ambulatory Visit: Payer: Self-pay

## 2019-01-10 DIAGNOSIS — Z79899 Other long term (current) drug therapy: Secondary | ICD-10-CM | POA: Insufficient documentation

## 2019-01-10 DIAGNOSIS — Y9389 Activity, other specified: Secondary | ICD-10-CM | POA: Diagnosis not present

## 2019-01-10 DIAGNOSIS — Y92008 Other place in unspecified non-institutional (private) residence as the place of occurrence of the external cause: Secondary | ICD-10-CM | POA: Insufficient documentation

## 2019-01-10 DIAGNOSIS — Y999 Unspecified external cause status: Secondary | ICD-10-CM | POA: Diagnosis not present

## 2019-01-10 DIAGNOSIS — S52125A Nondisplaced fracture of head of left radius, initial encounter for closed fracture: Secondary | ICD-10-CM | POA: Diagnosis not present

## 2019-01-10 DIAGNOSIS — W01198A Fall on same level from slipping, tripping and stumbling with subsequent striking against other object, initial encounter: Secondary | ICD-10-CM | POA: Diagnosis not present

## 2019-01-10 DIAGNOSIS — S01511A Laceration without foreign body of lip, initial encounter: Secondary | ICD-10-CM | POA: Diagnosis not present

## 2019-01-10 DIAGNOSIS — S59912A Unspecified injury of left forearm, initial encounter: Secondary | ICD-10-CM | POA: Diagnosis present

## 2019-01-10 DIAGNOSIS — Z23 Encounter for immunization: Secondary | ICD-10-CM | POA: Insufficient documentation

## 2019-01-10 DIAGNOSIS — Z87891 Personal history of nicotine dependence: Secondary | ICD-10-CM | POA: Diagnosis not present

## 2019-01-10 MED ORDER — LIDOCAINE HCL (PF) 1 % IJ SOLN
10.0000 mL | Freq: Once | INTRAMUSCULAR | Status: AC
  Administered 2019-01-10: 17:00:00 10 mL
  Filled 2019-01-10: qty 30

## 2019-01-10 MED ORDER — HYDROCODONE-ACETAMINOPHEN 5-325 MG PO TABS
1.0000 | ORAL_TABLET | ORAL | Status: AC
  Administered 2019-01-10: 15:00:00 1 via ORAL
  Filled 2019-01-10: qty 1

## 2019-01-10 MED ORDER — TETANUS-DIPHTH-ACELL PERTUSSIS 5-2.5-18.5 LF-MCG/0.5 IM SUSP
0.5000 mL | Freq: Once | INTRAMUSCULAR | Status: AC
  Administered 2019-01-10: 15:00:00 0.5 mL via INTRAMUSCULAR
  Filled 2019-01-10: qty 0.5

## 2019-01-10 NOTE — ED Provider Notes (Signed)
[facility]-EMERGENCY DEPT Provider Note   CSN: [id number] Arrival date & time: 01/10/19  1234    History   Chief Complaint Chief Complaint  Patient presents with  . Fall    HPI Laura Cooper is a 23 y.o. female.     HPI Patient presents to the emergency room for evaluation of injuries sustained after a trip and fall at home.  Patient tripped over some outdoor furniture.  She fell onto her face and her left arm.  Patient sustained a laceration of her upper lip.  Think she will need sutures.  Patient also injured her nose but thinks it is just at the tip of her nose because she does not have any bony tenderness.  She did not lose consciousness.  She did sustain some abrasions to her right arm and her knee but those are not significant.  She does have pain in her left upper arm and elbow.  Patient feels that it is difficult to extend and rotate her elbow. Past Medical History:  Diagnosis Date  . Anxiety   . Spondylisthesis     There are no active problems to display for this patient.   Past Surgical History:  Procedure Laterality Date  . wisdom       OB History   No obstetric history on file.      Home Medications    Prior to Admission medications   Medication Sig Start Date End Date Taking? Authorizing Provider  Aspirin-Acetaminophen-Caffeine (GOODY HEADACHE PO) Take 1 packet by mouth as needed (back pain).   Yes [provider]  medroxyPROGESTERone (DEPO-PROVERA) 150 MG/ML injection Inject 150 mg into the muscle every 3 (three) months.   Yes [provider]  cyclobenzaprine (FLEXERIL) 5 MG tablet 1 pill by mouth up to every 8 hours as needed. Start with one pill by mouth each bedtime as needed due to sedation Patient not taking: Reported on 01/10/2019 04/25/18   Shade FloodGreene, Jeffrey R, MD  meloxicam (MOBIC) 7.5 MG tablet Take 1 tablet (7.5 mg total) by mouth daily. Patient not taking: Reported on 01/10/2019 04/25/18   Shade FloodGreene, Jeffrey  R, MD    Family History History reviewed. No pertinent family history.  Social History Social History   Tobacco Use  . Smoking status: Former Games developermoker  . Smokeless tobacco: Never Used  Substance Use Topics  . Alcohol use: Yes    Comment: once or twice a month   . Drug use: Yes    Types: Marijuana    Comment: Last used: daily      Allergies   Patient has no known allergies.   Review of Systems Review of Systems  All other systems reviewed and are negative.    Physical Exam Updated Vital Signs BP 122/78   Pulse 72   Temp 98 F (36.7 C) (Oral)   Resp 16   Ht 1.753 m (5\' 9" )   Wt 107 kg   SpO2 99%   BMI 34.85 kg/m   Physical Exam Vitals signs and nursing note reviewed.  Constitutional:      General: She is not in acute distress.    Appearance: She is well-developed.  HENT:     Head: Normocephalic and atraumatic.     Comments: No bony tenderness to the nose, small amount of blood at the tip of her nose, nose rings noted bilaterally; vertical laceration of the upper lip that extends into the vermilion border    Right Ear: External ear normal.  Left Ear: External ear normal.  Eyes:     General: No scleral icterus.       Right eye: No discharge.        Left eye: No discharge.     Conjunctiva/sclera: Conjunctivae normal.  Neck:     Musculoskeletal: Neck supple.     Trachea: No tracheal deviation.  Cardiovascular:     Rate and Rhythm: Normal rate.  Pulmonary:     Effort: Pulmonary effort is normal. No respiratory distress.     Breath sounds: No stridor.  Abdominal:     General: There is no distension.  Musculoskeletal:        General: No swelling or deformity.     Left shoulder: Normal.     Left elbow: She exhibits decreased range of motion. She exhibits no deformity. Tenderness found.     Left wrist: Normal.     Right hip: Normal.     Left hip: Normal.     Cervical back: Normal.     Thoracic back: Normal.     Lumbar back: Normal.     Left  forearm: She exhibits tenderness.     Comments: Tenderness palpation proximal forearm on the left side  Skin:    General: Skin is warm and dry.     Findings: No rash.  Neurological:     Mental Status: She is alert.     Cranial Nerves: Cranial nerve deficit: no gross deficits.      ED Treatments / Results  Labs (all labs ordered are listed, but only abnormal results are displayed) Labs Reviewed - No data to display  EKG None  Radiology Dg Elbow Complete Left  Result Date: 01/10/2019 CLINICAL DATA:  Recent fall with elbow pain, initial encounter EXAM: LEFT ELBOW - COMPLETE 3+ VIEW COMPARISON:  None. FINDINGS: There is a mildly displaced fracture of the radial head laterally. Elevation of the anterior and posterior fat pads is noted. Mild impaction at the fracture site is noted as well. IMPRESSION: Radial head fracture with mild impaction and joint effusion. Electronically Signed   By: Alcide Clever M.D.   On: 01/10/2019 14:19   Dg Forearm Left  Result Date: 01/10/2019 CLINICAL DATA:  Recent fall with elbow pain, initial encounter EXAM: LEFT FOREARM - 2 VIEW COMPARISON:  None. FINDINGS: Elevation of the anterior and posterior fat pads is noted consistent with the known joint effusion related to the radial head fracture. No other fractures are seen. No other soft tissue abnormality is noted. IMPRESSION: Elbow joint effusion related to radial head fracture. Electronically Signed   By: Alcide Clever M.D.   On: 01/10/2019 14:20    Procedures Procedures (including critical care time)  Medications Ordered in ED Medications  Tdap (BOOSTRIX) injection 0.5 mL (0.5 mLs Intramuscular Given 01/10/19 1439)  lidocaine (PF) (XYLOCAINE) 1 % injection 10 mL (10 mLs Infiltration Given 01/10/19 1700)  HYDROcodone-acetaminophen (NORCO/VICODIN) 5-325 MG per tablet 1 tablet (1 tablet Oral Given 01/10/19 1439)     Initial Impression / Assessment and Plan / ED Course  I have reviewed the triage vital signs and  the nursing notes.  Pertinent labs & imaging results that were available during my care of the patient were reviewed by me and considered in my medical decision making (see chart for details).   Xrays notable for a radial head fx.  Pt splinted and slinged.  Outpt follow up with ortho.  Laceration repaired by PA Lawyer.  Discussed outpt follow up and suture removal.  Final Clinical Impressions(s) / ED Diagnoses   Final diagnoses:  Lip laceration, initial encounter  Closed nondisplaced fracture of head of left radius, initial encounter    ED Discharge Orders    None       Linwood Dibbles, MD 01/10/19 1746

## 2019-01-10 NOTE — ED Provider Notes (Signed)
LACERATION REPAIR Performed by: Carlyle Dolly Authorized by: Jamesetta Orleans Kenyana Husak Consent: Verbal consent obtained. Risks and benefits: risks, benefits and alternatives were discussed Consent given by: patient Patient identity confirmed: provided demographic data Prepped and Draped in normal sterile fashion Wound explored  Laceration Location: L upper lip   Laceration Length: 2.5 cm  No Foreign Bodies seen or palpated  Anesthesia: local infiltration  Local anesthetic: lidocaine 2% wo epinephrine  Anesthetic total: 5 ml  Irrigation method: syringe Amount of cleaning: standard  Skin closure: 6-0 Vicryl and 7-0 Prolene  Number of sutures: 4 subcutaneous and 6 simple interrupted  Technique: see above  Patient tolerance: Patient tolerated the procedure well with no immediate complications.    Charlestine Night, PA-C 01/10/19 1732    Linwood Dibbles, MD 01/13/19 [phone]

## 2019-01-10 NOTE — ED Triage Notes (Signed)
Patient states she her shoe got caught in between boards and she fell. Patient is complaining of left arm pain and laceration to the left upper lip. Bleeding controlled to the laceration.

## 2019-01-10 NOTE — Discharge Instructions (Addendum)
Suture removal in 5 days.  Follow-up with orthopedics for further evaluation of your radial head fracture.  Take ibuprofen or Naprosyn as needed for pain, apply ice to help with the swelling

## 2019-01-16 ENCOUNTER — Encounter (HOSPITAL_COMMUNITY): Payer: Self-pay | Admitting: Emergency Medicine

## 2019-01-16 ENCOUNTER — Other Ambulatory Visit: Payer: Self-pay

## 2019-01-16 ENCOUNTER — Emergency Department (HOSPITAL_COMMUNITY)
Admission: EM | Admit: 2019-01-16 | Discharge: 2019-01-16 | Disposition: A | Discharge status: Home / Self Care | Payer: BC Managed Care – PPO | Attending: Emergency Medicine | Admitting: Emergency Medicine

## 2019-01-16 DIAGNOSIS — Z79899 Other long term (current) drug therapy: Secondary | ICD-10-CM | POA: Insufficient documentation

## 2019-01-16 DIAGNOSIS — Z87891 Personal history of nicotine dependence: Secondary | ICD-10-CM | POA: Diagnosis not present

## 2019-01-16 DIAGNOSIS — Z4802 Encounter for removal of sutures: Secondary | ICD-10-CM | POA: Diagnosis not present

## 2019-01-16 NOTE — ED Notes (Signed)
ED Provider at bedside. 

## 2019-01-16 NOTE — ED Triage Notes (Signed)
Pt here to get stitches removed from lip from last week.

## 2019-01-16 NOTE — ED Provider Notes (Signed)
[facility] DEPT Provider Note   CSN: [id number] Arrival date & time: 01/16/19  1021    History   Chief Complaint Chief Complaint  Patient presents with  . Suture / Staple Removal    HPI Laura Cooper is a 23 y.o. female who presents today for suture removal.  She had sutures placed on 01/10/2019 in her left-sided lip.  Review shows that she had 6 simple interrupted 7-0 Prolene sutures placed.  She denies any drainage, abnormal redness or swelling.       HPI  Past Medical History:  Diagnosis Date  . Anxiety   . Spondylisthesis     There are no active problems to display for this patient.   Past Surgical History:  Procedure Laterality Date  . wisdom       OB History   No obstetric history on file.      Home Medications    Prior to Admission medications   Medication Sig Start Date End Date Taking? Authorizing Provider  Aspirin-Acetaminophen-Caffeine (GOODY HEADACHE PO) Take 1 packet by mouth as needed (back pain).    [provider]  cyclobenzaprine (FLEXERIL) 5 MG tablet 1 pill by mouth up to every 8 hours as needed. Start with one pill by mouth each bedtime as needed due to sedation Patient not taking: Reported on 01/10/2019 04/25/18   Wendie Agreste, MD  medroxyPROGESTERone (DEPO-PROVERA) 150 MG/ML injection Inject 150 mg into the muscle every 3 (three) months.    [provider]  meloxicam (MOBIC) 7.5 MG tablet Take 1 tablet (7.5 mg total) by mouth daily. Patient not taking: Reported on 01/10/2019 04/25/18   Wendie Agreste, MD    Family History No family history on file.  Social History Social History   Tobacco Use  . Smoking status: Former Research scientist (life sciences)  . Smokeless tobacco: Never Used  Substance Use Topics  . Alcohol use: Yes    Comment: once or twice a month   . Drug use: Yes    Types: Marijuana    Comment: Last used: daily      Allergies   Patient has no known allergies.   Review of Systems  Review of Systems  Constitutional: Negative for chills and fever.  Respiratory: Negative for cough and shortness of breath.   Skin: Positive for wound. Negative for color change.  All other systems reviewed and are negative.    Physical Exam Updated Vital Signs BP 138/77 (BP Location: Right Arm)   Pulse 76   Temp 98.8 F (37.1 C) (Oral)   Resp 16   SpO2 99%   Physical Exam Vitals signs and nursing note reviewed.  Constitutional:      General: She is not in acute distress.    Appearance: She is not ill-appearing.  HENT:     Head: Normocephalic.     Mouth/Throat:     Comments: Laceration on left sided upper lip is clean and dry without abnormal swelling or drainage.  6 sutures remain in place. Cardiovascular:     Rate and Rhythm: Normal rate.  Pulmonary:     Effort: Pulmonary effort is normal. No respiratory distress.  Neurological:     Mental Status: She is alert.      ED Treatments / Results  Labs (all labs ordered are listed, but only abnormal results are displayed) Labs Reviewed - No data to display  EKG None  Radiology No results found.  Procedures .Suture Removal Date/Time: 01/16/2019 12:04 PM Performed by: Phylliss Bob,  Earnstine RegalElizabeth W, PA-C Authorized by: Cristina GongHammond, Lela Gell W, PA-C   Consent:    Consent obtained:  Verbal   Consent given by:  Patient   Risks discussed:  Bleeding, pain and wound separation (infection)   Alternatives discussed:  No treatment, alternative treatment and referral Location:    Location:  Mouth   Mouth location:  Upper exterior lip Procedure details:    Wound appearance:  No signs of infection, good wound healing and clean   Number of sutures removed:  6 Post-procedure details:    Post-removal:  No dressing applied   Patient tolerance of procedure:  Tolerated well, no immediate complications   (including critical care time)  Medications Ordered in ED Medications - No data to display   Initial Impression / Assessment and  Plan / ED Course  I have reviewed the triage vital signs and the nursing notes.  Pertinent labs & imaging results that were available during my care of the patient were reviewed by me and considered in my medical decision making (see chart for details).       Suture removal   Pt to ER for suture removal and wound check as above. Procedure tolerated well. Vitals normal, no signs of infection. Scar minimization & return precautions given at dc.  Return precautions were discussed with patient who states their understanding.  At the time of discharge patient denied any unaddressed complaints or concerns.  Patient is agreeable for discharge home.   Final Clinical Impressions(s) / ED Diagnoses   Final diagnoses:  Encounter for removal of sutures    ED Discharge Orders    None       Norman ClayHammond, Bob Daversa W, PA-C 01/16/19 1540    Azalia Bilisampos, Kevin, MD 01/17/19 [phone]

## 2019-01-16 NOTE — ED Notes (Signed)
Bed: WTR5 Expected date:  Expected time:  Means of arrival:  Comments: 

## 2019-01-16 NOTE — Discharge Instructions (Signed)
As we discussed it is normal for you to have a small amount of drainage from your wound as the sutures were taken out today.

## 2019-08-09 ENCOUNTER — Other Ambulatory Visit: Payer: Self-pay | Admitting: Orthopedic Surgery

## 2019-08-10 ENCOUNTER — Other Ambulatory Visit: Payer: Self-pay | Admitting: *Deleted

## 2019-08-22 ENCOUNTER — Telehealth: Payer: Self-pay

## 2019-08-22 NOTE — Telephone Encounter (Signed)
Pt called regarding upcoming ALIF appt with Brabham. She was reminded to bring disc from Dr. Yevette Edwards with her to appt. Pt has no further questions/concerns.

## 2019-09-04 ENCOUNTER — Other Ambulatory Visit: Payer: Self-pay

## 2019-09-04 ENCOUNTER — Ambulatory Visit (INDEPENDENT_AMBULATORY_CARE_PROVIDER_SITE_OTHER): Payer: BC Managed Care – PPO | Admitting: Surgery

## 2019-09-04 ENCOUNTER — Encounter: Payer: Self-pay | Admitting: Surgery

## 2019-09-04 VITALS — BP 130/72 | HR 88 | Temp 97.7°F | Resp 20 | Ht 69.0 in | Wt 244.0 lb

## 2019-09-04 DIAGNOSIS — M479 Spondylosis, unspecified: Secondary | ICD-10-CM

## 2019-09-04 NOTE — Progress Notes (Signed)
 [facility]  Patient name: Laura Cooper  MRN: [id number]        DOB: 02/28/1996          Sex: female   REQUESTING PROVIDER:    Dr. Dumonski   REASON FOR CONSULT:    Degenerative back disease  HISTORY OF PRESENT ILLNESS:   Laura Cooper is a 24 y.o. female, who is referred for consideration of anterior exposure for L5-S1 instrumentation.  She denies any prior abdominal surgery.  She does have a history of smoking and vaping.  PAST MEDICAL HISTORY        Past Medical History:  Diagnosis Date  . Anxiety   . Low back pain    Chronic Axial  . Spondylolisthesis at L5-S1 level      FAMILY HISTORY   History reviewed. No pertinent family history.  SOCIAL HISTORY:   Social History        Socioeconomic History  . Marital status: Single    Spouse name: Not on file  . Number of children: Not on file  . Years of education: Not on file  . Highest education level: Not on file  Occupational History  . Not on file  Tobacco Use  . Smoking status: Current Every Day Smoker  . Smokeless tobacco: Never Used  Substance and Sexual Activity  . Alcohol use: Yes    Comment: once or twice a month   . Drug use: Yes    Types: Marijuana    Comment: Last used: daily   . Sexual activity: Not on file  Other Topics Concern  . Not on file  Social History Narrative  . Not on file   Social Determinants of Health      Financial Resource Strain:   . Difficulty of Paying Living Expenses: Not on file  Food Insecurity:   . Worried About Running Out of Food in the Last Year: Not on file  . Ran Out of Food in the Last Year: Not on file  Transportation Needs:   . Lack of Transportation (Medical): Not on file  . Lack of Transportation (Non-Medical): Not on file  Physical Activity:   . Days of Exercise per Week: Not on file  . Minutes of Exercise per Session: Not on file  Stress:   .  Feeling of Stress : Not on file  Social Connections:   . Frequency of Communication with Friends and Family: Not on file  . Frequency of Social Gatherings with Friends and Family: Not on file  . Attends Religious Services: Not on file  . Active Member of Clubs or Organizations: Not on file  . Attends Club or Organization Meetings: Not on file  . Marital Status: Not on file  Intimate Partner Violence:   . Fear of Current or Ex-Partner: Not on file  . Emotionally Abused: Not on file  . Physically Abused: Not on file  . Sexually Abused: Not on file    ALLERGIES:    No Known Allergies  CURRENT MEDICATIONS:          Current Outpatient Medications  Medication Sig Dispense Refill  . Aspirin-Acetaminophen-Caffeine (GOODY HEADACHE PO) Take 1 packet by mouth as needed (back pain).    . DULoxetine (CYMBALTA) 30 MG capsule     . medroxyPROGESTERone (DEPO-PROVERA) 150 MG/ML injection Inject 150 mg into the muscle every 3 (three) months.    . meloxicam (MOBIC) 7.5 MG tablet Take by mouth.    . methocarbamol (  ROBAXIN) 500 MG tablet Take 500 mg by mouth every 6 (six) hours as needed.    . traMADol (ULTRAM) 50 MG tablet Take 50 mg by mouth 3 (three) times daily as needed.     No current facility-administered medications for this visit.    REVIEW OF SYSTEMS:   [X] denotes positive finding, [ ] denotes negative finding Cardiac  Comments:  Chest pain or chest pressure:    Shortness of breath upon exertion:    Short of breath when lying flat:    Irregular heart rhythm:        Vascular    Pain in calf, thigh, or hip brought on by ambulation:    Pain in feet at night that wakes you up from your sleep:     Blood clot in your veins:    Leg swelling:         Pulmonary    Oxygen at home:    Productive cough:     Wheezing:         Neurologic    Sudden weakness in arms or legs:     Sudden numbness in arms or legs:       Sudden onset of difficulty speaking or slurred speech:    Temporary loss of vision in one eye:     Problems with dizziness:         Gastrointestinal    Blood in stool:      Vomited blood:         Genitourinary    Burning when urinating:     Blood in urine:        Psychiatric    Major depression:         Hematologic    Bleeding problems:    Problems with blood clotting too easily:        Skin    Rashes or ulcers:        Constitutional    Fever or chills:     PHYSICAL EXAM:      Vitals:   09/04/19 1037  BP: 130/72  Pulse: 88  Resp: 20  Temp: 97.7 F (36.5 C)  SpO2: 97%  Weight: 244 lb (110.7 kg)  Height: 5' 9" (1.753 m)    GENERAL: The patient is a well-nourished female, in no acute distress. The vital signs are documented above. CARDIAC: There is a regular rate and rhythm.  PULMONARY: Nonlabored respirations ABDOMEN: Soft and non-tender  MUSCULOSKELETAL: There are no major deformities or cyanosis. NEUROLOGIC: No focal weakness or paresthesias are detected. SKIN: There are no ulcers or rashes noted. PSYCHIATRIC: The patient has a normal affect.  STUDIES:   I have reviewed her back x-rays as well as her lumbar spine MRI L3-4: There is a shallow central protrusion and annular fissure without central canal or foraminal narrowing.  L4-5: Very shallow disc bulge and a small annular fissure are identified. The central canal and foramina are patent.  L5-S1: The disc is uncovered with a shallow bulge. The central canal is widely patent. Anterolisthesis and disc cause right foraminal narrowing. The exiting right L5 root appears compressed in the foramen. The left foramen is open. ASSESSMENT and PLAN   Degenerative back disease, L5-S1.  I discussed the details of my portion of the procedure including the incision and exposure.  We discussed the risk of injury to the iliac artery and vein as well as  the ureter.  We discussed the possibility of bleeding, wound infection, and hernia as well as

## 2019-09-12 NOTE — Pre-Procedure Instructions (Signed)
Your procedure is scheduled on Wednesday, February 10th.  Report to [facility] Entr[facility] "A" at       A.M., and check in at the Admitting office.  Call this number if you have problems the morning of surgery:  [phone]    Remember:  Do not eat after midnight the night before your surgery.  You may drink clear liquids until        the morning of your surgery.    Clear liquids allowed are: Water, Non-Citrus Juices (without pulp), Carbonated Beverages, Clear Tea, Black Coffee Only, and Gatorade.  Enhanced Recovery after Surgery for Orthopedics Enhanced Recovery after Surgery is a protocol used to improve the stress on your body and your recovery after surgery.  .  . The day of surgery (if you do NOT have diabetes):  o Drink ONE (1) Pre-Surgery Clear Ensure by              .   o This drink was given to you during your hospital  pre-op appointment visit. o Nothing else to drink after completing the  Pre-Surgery Clear Ensure.      Take these medicines the morning of surgery with A SIP OF WATER :  IF NEEDED:  methocarbamol (ROBAXIN) traMADol (ULTRAM)  7 days prior to surgery STOP taking any Aspirin (unless otherwise instructed by your surgeon), NSAIDs such as meloxicam (MOBIC), Aleve, Naproxen, Ibuprofen, Motrin, Advil, Goody's, BC's, all herbal medications, fish oil, and all vitamins.    The Morning of Surgery  Do not wear jewelry, make-up or nail polish.  Do not wear lotions, powders, perfumes, or deodorant.  Do not shave 48 hours prior to surgery.   Do not bring valuables to the hospital.  [facility] is not responsible for any belongings or valuables.  If you are a smoker, DO NOT Smoke 24 hours prior to surgery  If you wear a CPAP at night please bring your mask the morning of surgery.  Remember that you must have someone to transport you home after your surgery, and remain with you for 24 hours if you are discharged the same day.   Please bring cases for  contacts, glasses, hearing aids, dentures or bridgework because it cannot be worn into surgery.    Leave your suitcase in the car.  After surgery it may be brought to your room.  For patients admitted to the hospital, discharge time will be determined by your treatment team.  Patients discharged the day of surgery will not be allowed to drive home.    Special instructions:   [facility]- Preparing For Surgery  Before surgery, you can play an important role. Because skin is not sterile, your skin needs to be as free of germs as possible. You can reduce the number of germs on your skin by washing with CHG (chlorahexidine gluconate) Soap before surgery.  CHG is an antiseptic cleaner which kills germs and bonds with the skin to continue killing germs even after washing.     Please do not use if you have an allergy to CHG or antibacterial soaps. If your skin becomes reddened/irritated stop using the CHG.  Do not shave (including legs and underarms) for at least 48 hours prior to first CHG shower. It is OK to shave your face.  Please follow these instructions carefully.   1. Shower the NIGHT BEFORE SURGERY and the MORNING OF SURGERY with CHG Soap.   2. If you chose to wash your hair, wash your  hair first as usual with your normal shampoo.  3. After you shampoo, rinse your hair and body thoroughly to remove the shampoo.  4. Use CHG as you would any other liquid soap. You can apply CHG directly to the skin and wash gently with a scrungie or a clean washcloth.   5. Apply the CHG Soap to your body ONLY FROM THE NECK DOWN.  Do not use on open wounds or open sores. Avoid contact with your eyes, ears, mouth and genitals (private parts). Wash Face and genitals (private parts)  with your normal soap.   6. Wash thoroughly, paying special attention to the area where your surgery will be performed.  7. Thoroughly rinse your body with warm water from the neck down.  8. DO NOT shower/wash with your  normal soap after using and rinsing off the CHG Soap.  9. Pat yourself dry with a CLEAN TOWEL.  10. Wear CLEAN PAJAMAS to bed the night before surgery, wear comfortable clothes the morning of surgery.  11. Place CLEAN SHEETS on your bed the night of your first shower and DO NOT SLEEP WITH PETS.    Day of Surgery:   Remember to brush your teeth WITH YOUR REGULAR TOOTHPASTE. Please shower the morning of surgery with the CHG soap. Do not apply any deodorants/lotions. Please wear clean clothes to the hospital/surgery center.      Please read over the following fact sheets that you were given.

## 2019-09-13 ENCOUNTER — Inpatient Hospital Stay (HOSPITAL_COMMUNITY)
Admission: RE | Admit: 2019-09-13 | Discharge: 2019-09-13 | Disposition: A | Discharge status: Home / Self Care | Payer: BC Managed Care – PPO | Source: Ambulatory Visit

## 2019-09-13 ENCOUNTER — Encounter (HOSPITAL_COMMUNITY): Payer: Self-pay | Admitting: Vascular Surgery

## 2019-09-16 ENCOUNTER — Inpatient Hospital Stay (HOSPITAL_COMMUNITY): Admission: RE | Admit: 2019-09-16 | Payer: BC Managed Care – PPO | Source: Ambulatory Visit

## 2019-09-20 ENCOUNTER — Inpatient Hospital Stay (HOSPITAL_COMMUNITY)
Admission: RE | Admit: 2019-09-20 | Payer: BC Managed Care – PPO | Source: Home / Self Care | Admitting: Orthopedic Surgery

## 2019-09-20 ENCOUNTER — Encounter (HOSPITAL_COMMUNITY): Admission: RE | Payer: Self-pay | Source: Home / Self Care

## 2019-09-20 SURGERY — ANTERIOR LATERAL LUMBAR FUSION 1 LEVEL
Anesthesia: General

## 2019-09-27 ENCOUNTER — Other Ambulatory Visit: Payer: Self-pay | Admitting: Orthopedic Surgery

## 2019-10-16 NOTE — Pre-Procedure Instructions (Signed)
Laura Cooper  10/16/2019      [id number] - [place], [address] AT [facility] OF [address] [place] [address] Phone: [phone] Fax: [phone]    Your procedure is scheduled for March 17  Report to [facility] Entrance A at 6:30 A.M.  Call this number if you have problems the morning of surgery:  [phone]   Remember:  Do not eat or drink after midnight.  You may drink clear liquids until 5:30 .  Clear liquids allowed are:                    Water, Juice (non-citric and without pulp), Carbonated beverages, Clear Tea, Black Coffee only, Plain Jell-O only, Gatorade and Plain Popsicles only                 Enhanced Recovery after Surgery for Orthopedics Enhanced Recovery after Surgery is a protocol used to improve the stress on your body and your recovery after surgery.  Patient Instructions  . The night before surgery:  o No food after midnight. ONLY clear liquids after midnight  .  Marland Kitchen The day of surgery (if you do NOT have diabetes):  o Drink ONE (1) Pre-Surgery Clear Ensure as directed.   o This drink was given to you during your hospital  pre-op appointment visit. o The pre-op nurse will instruct you on the time to drink the  Pre-Surgery Ensure depending on your surgery time. o Finish the drink at the designated time by the pre-op nurse.  o Nothing else to drink after completing the  Pre-Surgery Clear Ensure.  . The day of surgery (if you have diabetes): o  o Drink ONE (1) Gatorade 2 (G2) as directed. o This drink was given to you during your hospital  pre-op appointment visit.  o The pre-op nurse will instruct you on the time to drink the   Gatorade 2 (G2) depending on your surgery time. o Color of the Gatorade may vary. Red is not allowed. o Nothing else to drink after completing the  Gatorade 2 (G2).         If you have questions, please contact your surgeon's office.   Take these medicines  the morning of surgery with A SIP OF WATER :             Duloxetine (cymbalta)            7 days prior to surgery STOP taking any Aspirin (unless otherwise instructed by your surgeon), Aleve, Naproxen, Ibuprofen, Motrin, Advil, Goody's, BC's, all herbal medications, fish oil, and all vitamins, meloxicam (mobic)    Do not wear jewelry, make-up or nail polish.  Do not wear lotions, powders, or perfumes, or deodorant.  Do not shave 48 hours prior to surgery.  Men may shave face and neck.  Do not bring valuables to the hospital.  [facility] is not responsible for any belongings or valuables.  Contacts, dentures or bridgework may not be worn into surgery.  Leave your suitcase in the car.  After surgery it may be brought to your room.  For patients admitted to the hospital, discharge time will be determined by your treatment team.  Patients discharged the day of surgery will not be allowed to drive home.    Special instructions:  [facility]- Preparing For Surgery  Before surgery, you can play an important role. Because skin  is not sterile, your skin needs to be as free of germs as possible. You can reduce the number of germs on your skin by washing with CHG (chlorahexidine gluconate) Soap before surgery.  CHG is an antiseptic cleaner which kills germs and bonds with the skin to continue killing germs even after washing.    Oral Hygiene is also important to reduce your risk of infection.  Remember - BRUSH YOUR TEETH THE MORNING OF SURGERY WITH YOUR REGULAR TOOTHPASTE  Please do not use if you have an allergy to CHG or antibacterial soaps. If your skin becomes reddened/irritated stop using the CHG.  Do not shave (including legs and underarms) for at least 48 hours prior to first CHG shower. It is OK to shave your face.  Please follow these instructions carefully.   1. Shower the NIGHT BEFORE SURGERY and the MORNING OF SURGERY with CHG.   2. If you chose to wash your hair, wash your hair  first as usual with your normal shampoo.  3. After you shampoo, rinse your hair and body thoroughly to remove the shampoo.  4. Use CHG as you would any other liquid soap. You can apply CHG directly to the skin and wash gently with a scrungie or a clean washcloth.   5. Apply the CHG Soap to your body ONLY FROM THE NECK DOWN.  Do not use on open wounds or open sores. Avoid contact with your eyes, ears, mouth and genitals (private parts). Wash Face and genitals (private parts)  with your normal soap.  6. Wash thoroughly, paying special attention to the area where your surgery will be performed.  7. Thoroughly rinse your body with warm water from the neck down.  8. DO NOT shower/wash with your normal soap after using and rinsing off the CHG Soap.  9. Pat yourself dry with a CLEAN TOWEL.  10. Wear CLEAN PAJAMAS to bed the night before surgery, wear comfortable clothes the morning of surgery  11. Place CLEAN SHEETS on your bed the night of your first shower and DO NOT SLEEP WITH PETS.    Day of Surgery:  Do not apply any deodorants/lotions.  Please wear clean clothes to the hospital/surgery center.   Remember to brush your teeth WITH YOUR REGULAR TOOTHPASTE.    Please read over the following fact sheets that you were given. Coughing and Deep Breathing, MRSA Information and Surgical Site Infection Prevention

## 2019-10-17 ENCOUNTER — Other Ambulatory Visit: Payer: Self-pay

## 2019-10-17 ENCOUNTER — Encounter (HOSPITAL_COMMUNITY)
Admission: RE | Admit: 2019-10-17 | Discharge: 2019-10-17 | Disposition: A | Discharge status: Home / Self Care | Payer: BC Managed Care – PPO | Source: Ambulatory Visit | Attending: Orthopedic Surgery | Admitting: Orthopedic Surgery

## 2019-10-17 ENCOUNTER — Encounter (HOSPITAL_COMMUNITY): Payer: Self-pay

## 2019-10-17 DIAGNOSIS — Z01812 Encounter for preprocedural laboratory examination: Secondary | ICD-10-CM | POA: Insufficient documentation

## 2019-10-17 HISTORY — DX: Depression, unspecified: F32.A

## 2019-10-17 LAB — CBC WITH DIFFERENTIAL/PLATELET
Abs Immature Granulocytes: 0.01 10*3/uL (ref 0.00–0.07)
Basophils Absolute: 0.1 10*3/uL (ref 0.0–0.1)
Basophils Relative: 1 %
Eosinophils Absolute: 0.1 10*3/uL (ref 0.0–0.5)
Eosinophils Relative: 2 %
HCT: 44.2 % (ref 36.0–46.0)
Hemoglobin: 14.4 g/dL (ref 12.0–15.0)
Immature Granulocytes: 0 %
Lymphocytes Relative: 31 %
Lymphs Abs: 1.8 10*3/uL (ref 0.7–4.0)
MCH: 29.8 pg (ref 26.0–34.0)
MCHC: 32.6 g/dL (ref 30.0–36.0)
MCV: 91.3 fL (ref 80.0–100.0)
Monocytes Absolute: 0.4 10*3/uL (ref 0.1–1.0)
Monocytes Relative: 7 %
Neutro Abs: 3.5 10*3/uL (ref 1.7–7.7)
Neutrophils Relative %: 59 %
Platelets: 195 10*3/uL (ref 150–400)
RBC: 4.84 MIL/uL (ref 3.87–5.11)
RDW: 12.5 % (ref 11.5–15.5)
WBC: 5.9 10*3/uL (ref 4.0–10.5)
nRBC: 0 % (ref 0.0–0.2)

## 2019-10-17 LAB — URINALYSIS, ROUTINE W REFLEX MICROSCOPIC
Bilirubin Urine: NEGATIVE
Glucose, UA: NEGATIVE mg/dL
Hgb urine dipstick: NEGATIVE
Ketones, ur: NEGATIVE mg/dL
Leukocytes,Ua: NEGATIVE
Nitrite: NEGATIVE
Protein, ur: NEGATIVE mg/dL
Specific Gravity, Urine: 1.02 (ref 1.005–1.030)
pH: 7 (ref 5.0–8.0)

## 2019-10-17 LAB — PROTIME-INR
INR: 1 (ref 0.8–1.2)
Prothrombin Time: 13.1 seconds (ref 11.4–15.2)

## 2019-10-17 LAB — COMPREHENSIVE METABOLIC PANEL
ALT: 25 U/L (ref 0–44)
AST: 21 U/L (ref 15–41)
Albumin: 3.7 g/dL (ref 3.5–5.0)
Alkaline Phosphatase: 56 U/L (ref 38–126)
Anion gap: 7 (ref 5–15)
BUN: 13 mg/dL (ref 6–20)
CO2: 24 mmol/L (ref 22–32)
Calcium: 9.2 mg/dL (ref 8.9–10.3)
Chloride: 111 mmol/L (ref 98–111)
Creatinine, Ser: 0.93 mg/dL (ref 0.44–1.00)
GFR calc Af Amer: >60 mL/min (ref >60)
GFR calc non Af Amer: >60 mL/min (ref >60)
Glucose, Bld: 88 mg/dL (ref 70–99)
Potassium: 4.3 mmol/L (ref 3.5–5.1)
Sodium: 142 mmol/L (ref 135–145)
Total Bilirubin: 0.8 mg/dL (ref 0.3–1.2)
Total Protein: 6.4 g/dL — ABNORMAL LOW (ref 6.5–8.1)

## 2019-10-17 LAB — SURGICAL PCR SCREEN
MRSA, PCR: NEGATIVE
Staphylococcus aureus: NEGATIVE

## 2019-10-17 LAB — APTT: aPTT: 26 seconds (ref 24–36)

## 2019-10-17 LAB — TYPE AND SCREEN
ABO/RH(D): B POS
Antibody Screen: NEGATIVE

## 2019-10-17 LAB — ABO/RH: ABO/RH(D): B POS

## 2019-10-17 NOTE — Progress Notes (Signed)
PCP - Dr. Carlynn Purl [facility] Med Cardiologist - denies  PPM/ICD - N/A Device Orders -N/A  Rep Notified - N/A  Chest x-ray - N/A EKG - N/A Stress Test - denies ECHO - denies Cardiac Cath - denies  Sleep Study - denies CPAP - denies  Blood Thinner Instructions:N/A Aspirin Instructions:N/A  ERAS Protcol -Yes, protocol implemented as ordered.  PRE-SURGERY Ensure or G2- Pt provided with a Pre-Surgical Ensure and instructions on use.   COVID TEST- Scheduled for March 15th. Pt aware of quarantine guidelines.    Anesthesia review: No  Patient denies shortness of breath, fever, cough and chest pain at PAT appointment   All instructions explained to the patient, with a verbal understanding of the material. Patient agrees to go over the instructions while at home for a better understanding. Patient also instructed to self quarantine after being tested for COVID-19. The opportunity to ask questions was provided.    Coronavirus Screening  Have you experienced the following symptoms:  Cough yes/no: No Fever (>100.43F)  yes/no: No Runny nose yes/no: No Sore throat yes/no: No Difficulty breathing/shortness of breath  yes/no: No  Have you or a family member traveled in the last 14 days and where? yes/no: No   If the patient indicates "YES" to the above questions, their PAT will be rescheduled to limit the exposure to others and, the surgeon will be notified. THE PATIENT WILL NEED TO BE ASYMPTOMATIC FOR 14 DAYS.   If the patient is not experiencing any of these symptoms, the PAT nurse will instruct them to NOT bring anyone with them to their appointment since they may have these symptoms or traveled as well.   Please remind your patients and families that hospital visitation restrictions are in effect and the importance of the restrictions.

## 2019-10-17 NOTE — Pre-Procedure Instructions (Signed)
Laura Cooper  10/17/2019      [id number] - [place], [place] - [address] AT [facility] OF [address] [place] [address] Phone: [phone] Fax: [phone]    Your procedure is scheduled for March 17  Report to [facility] Entrance A at 6:30 A.M.  Call this number if you have problems the morning of surgery:  [phone]   Remember:  Do not eat or drink after midnight.  You may drink clear liquids until 5:30 a.m. Clear liquids allowed are: Water, Juice (non-citric and without pulp), Carbonated beverages, Clear Tea, Black Coffee only, Plain Jell-O only, Gatorade and Plain Popsicles only.                 Enhanced Recovery after Surgery for Orthopedics Enhanced Recovery after Surgery is a protocol used to improve the stress on your body and your recovery after surgery.  Patient Instructions  . The night before surgery:  o No food after midnight. ONLY clear liquids after midnight  .  Marland Kitchen The day of surgery (if you do NOT have diabetes):  o Drink ONE (1) Pre-Surgery Clear Ensure as directed.   o This drink was given to you during your hospital  pre-op appointment visit. o The pre-op nurse will instruct you on the time to drink the  Pre-Surgery Ensure depending on your surgery time. o Finish the drink at the designated time by the pre-op nurse.  o Nothing else to drink after completing the  Pre-Surgery Clear Ensure.         If you have questions, please contact your surgeon's office.   Take these medicines the morning of surgery with A SIP OF WATER :             Duloxetine (cymbalta)            7 days prior to surgery STOP taking any Aspirin (unless otherwise instructed by your surgeon), Aleve, Naproxen, Ibuprofen, Motrin, Advil, Goody's, BC's, all herbal medications, fish oil, and all vitamins, meloxicam (mobic)    Do not wear jewelry, make-up or nail polish.  Do not wear lotions, powders, or perfumes, or  deodorant.  Do not shave 48 hours prior to surgery.    Do not bring valuables to the hospital.  [facility] is not responsible for any belongings or valuables.  Contacts, dentures or bridgework may not be worn into surgery.  Leave your suitcase in the car.  After surgery it may be brought to your room.  For patients admitted to the hospital, discharge time will be determined by your treatment team.  Patients discharged the day of surgery will not be allowed to drive home.    Special instructions:  [facility]- Preparing For Surgery  Before surgery, you can play an important role. Because skin is not sterile, your skin needs to be as free of germs as possible. You can reduce the number of germs on your skin by washing with CHG (chlorahexidine gluconate) Soap before surgery.  CHG is an antiseptic cleaner which kills germs and bonds with the skin to continue killing germs even after washing.    Oral Hygiene is also important to reduce your risk of infection.  Remember - BRUSH YOUR TEETH THE MORNING OF SURGERY WITH YOUR REGULAR TOOTHPASTE  Please do not use if you have an allergy to CHG or antibacterial soaps. If your skin becomes reddened/irritated stop using the CHG.  Do not shave (including legs and underarms) for at least 48 hours prior to first CHG shower. It is OK to shave your face.  Please follow these instructions carefully.   1. Shower the NIGHT BEFORE SURGERY and the MORNING OF SURGERY with CHG.   2. If you chose to wash your hair, wash your hair first as usual with your normal shampoo.  3. After you shampoo, rinse your hair and body thoroughly to remove the shampoo.  4. Use CHG as you would any other liquid soap. You can apply CHG directly to the skin and wash gently with a scrungie or a clean washcloth.   5. Apply the CHG Soap to your body ONLY FROM THE NECK DOWN.  Do not use on open wounds or open sores. Avoid contact with your eyes, ears, mouth and genitals (private parts).  Wash Face and genitals (private parts)  with your normal soap.  6. Wash thoroughly, paying special attention to the area where your surgery will be performed.  7. Thoroughly rinse your body with warm water from the neck down.  8. DO NOT shower/wash with your normal soap after using and rinsing off the CHG Soap.  9. Pat yourself dry with a CLEAN TOWEL.  10. Wear CLEAN PAJAMAS to bed the night before surgery, wear comfortable clothes the morning of surgery  11. Place CLEAN SHEETS on your bed the night of your first shower and DO NOT SLEEP WITH PETS.    Day of Surgery:  Do not apply any deodorants/lotions.  Please wear clean clothes to the hospital/surgery center.   Remember to brush your teeth WITH YOUR REGULAR TOOTHPASTE.    Please read over the following fact sheets that you were given. Coughing and Deep Breathing, MRSA Information and Surgical Site Infection Prevention

## 2019-10-23 ENCOUNTER — Other Ambulatory Visit (HOSPITAL_COMMUNITY)
Admission: RE | Admit: 2019-10-23 | Discharge: 2019-10-23 | Disposition: A | Discharge status: Home / Self Care | Payer: BC Managed Care – PPO | Source: Ambulatory Visit | Attending: Orthopedic Surgery | Admitting: Orthopedic Surgery

## 2019-10-23 DIAGNOSIS — Z20822 Contact with and (suspected) exposure to covid-19: Secondary | ICD-10-CM | POA: Insufficient documentation

## 2019-10-23 DIAGNOSIS — Z01812 Encounter for preprocedural laboratory examination: Secondary | ICD-10-CM | POA: Insufficient documentation

## 2019-10-23 LAB — SARS CORONAVIRUS 2 (TAT 6-24 HRS): SARS Coronavirus 2: NEGATIVE

## 2019-10-24 NOTE — Anesthesia Preprocedure Evaluation (Addendum)
Anesthesia Evaluation  Patient identified by MRN, date of birth, ID band Patient awake    Reviewed: Allergy & Precautions, NPO status   Airway Mallampati: II  TM Distance: >3 FB     Dental   Pulmonary Current Smoker,    breath sounds clear to auscultation       Cardiovascular negative cardio ROS   Rhythm:Regular Rate:Normal     Neuro/Psych    GI/Hepatic Neg liver ROS,   Endo/Other  negative endocrine ROS  Renal/GU negative Renal ROS     Musculoskeletal   Abdominal   Peds  Hematology   Anesthesia Other Findings   Reproductive/Obstetrics                            Anesthesia Physical Anesthesia Plan  ASA: II  Anesthesia Plan: General   Post-op Pain Management:    Induction: Intravenous  PONV Risk Score and Plan: 2 and Ondansetron, Dexamethasone and Midazolam  Airway Management Planned: Oral ETT  Additional Equipment: Arterial line  Intra-op Plan:   Post-operative Plan: Possible Post-op intubation/ventilation  Informed Consent: I have reviewed the patients History and Physical, chart, labs and discussed the procedure including the risks, benefits and alternatives for the proposed anesthesia with the patient or authorized representative who has indicated his/her understanding and acceptance.     Dental advisory given  Plan Discussed with: Anesthesiologist  Anesthesia Plan Comments:        Anesthesia Quick Evaluation

## 2019-10-25 ENCOUNTER — Inpatient Hospital Stay (HOSPITAL_COMMUNITY): Payer: BC Managed Care – PPO | Admitting: Certified Registered"

## 2019-10-25 ENCOUNTER — Other Ambulatory Visit: Payer: Self-pay

## 2019-10-25 ENCOUNTER — Inpatient Hospital Stay (HOSPITAL_COMMUNITY): Payer: BC Managed Care – PPO

## 2019-10-25 ENCOUNTER — Inpatient Hospital Stay (HOSPITAL_COMMUNITY)
Admission: RE | Admit: 2019-10-25 | Discharge: 2019-10-26 | DRG: 455 | Disposition: A | Discharge status: Home / Self Care | Payer: BC Managed Care – PPO | Attending: Orthopedic Surgery | Admitting: Orthopedic Surgery

## 2019-10-25 ENCOUNTER — Encounter (HOSPITAL_COMMUNITY)
Admission: RE | Disposition: A | Discharge status: Home / Self Care | Payer: Self-pay | Source: Home / Self Care | Attending: Orthopedic Surgery

## 2019-10-25 DIAGNOSIS — M5116 Intervertebral disc disorders with radiculopathy, lumbar region: Secondary | ICD-10-CM | POA: Diagnosis present

## 2019-10-25 DIAGNOSIS — M5410 Radiculopathy, site unspecified: Secondary | ICD-10-CM | POA: Diagnosis present

## 2019-10-25 DIAGNOSIS — M488X6 Other specified spondylopathies, lumbar region: Secondary | ICD-10-CM | POA: Diagnosis present

## 2019-10-25 DIAGNOSIS — Z791 Long term (current) use of non-steroidal anti-inflammatories (NSAID): Secondary | ICD-10-CM | POA: Diagnosis not present

## 2019-10-25 DIAGNOSIS — F17200 Nicotine dependence, unspecified, uncomplicated: Secondary | ICD-10-CM | POA: Diagnosis present

## 2019-10-25 DIAGNOSIS — M4807 Spinal stenosis, lumbosacral region: Secondary | ICD-10-CM | POA: Diagnosis present

## 2019-10-25 DIAGNOSIS — Z79899 Other long term (current) drug therapy: Secondary | ICD-10-CM

## 2019-10-25 DIAGNOSIS — G8929 Other chronic pain: Secondary | ICD-10-CM | POA: Diagnosis present

## 2019-10-25 DIAGNOSIS — M5117 Intervertebral disc disorders with radiculopathy, lumbosacral region: Secondary | ICD-10-CM | POA: Diagnosis present

## 2019-10-25 DIAGNOSIS — M4317 Spondylolisthesis, lumbosacral region: Secondary | ICD-10-CM | POA: Diagnosis present

## 2019-10-25 DIAGNOSIS — Z419 Encounter for procedure for purposes other than remedying health state, unspecified: Secondary | ICD-10-CM

## 2019-10-25 DIAGNOSIS — F329 Major depressive disorder, single episode, unspecified: Secondary | ICD-10-CM | POA: Diagnosis present

## 2019-10-25 HISTORY — PX: ABDOMINAL EXPOSURE: SHX5708

## 2019-10-25 HISTORY — PX: ANTERIOR LUMBAR FUSION: SHX1170

## 2019-10-25 LAB — POCT PREGNANCY, URINE: Preg Test, Ur: NEGATIVE

## 2019-10-25 SURGERY — ANTERIOR LUMBAR FUSION 1 LEVEL
Anesthesia: General | Site: Spine Lumbar

## 2019-10-25 MED ORDER — MIDAZOLAM HCL 2 MG/2ML IJ SOLN
INTRAMUSCULAR | Status: DC | PRN
  Administered 2019-10-25: 2 mg via INTRAVENOUS

## 2019-10-25 MED ORDER — ALBUMIN HUMAN 5 % IV SOLN: INTRAVENOUS | Status: DC | PRN

## 2019-10-25 MED ORDER — ONDANSETRON HCL 4 MG PO TABS: 4.0000 mg | ORAL_TABLET | Freq: Four times a day (QID) | ORAL | Status: DC | PRN

## 2019-10-25 MED ORDER — ROCURONIUM BROMIDE 10 MG/ML (PF) SYRINGE
PREFILLED_SYRINGE | INTRAVENOUS | Status: DC | PRN
  Administered 2019-10-25: 50 mg via INTRAVENOUS
  Administered 2019-10-25: 30 mg via INTRAVENOUS
  Administered 2019-10-25: 50 mg via INTRAVENOUS

## 2019-10-25 MED ORDER — SODIUM CHLORIDE 0.9% FLUSH: 3.0000 mL | INTRAVENOUS | Status: DC | PRN

## 2019-10-25 MED ORDER — LACTATED RINGERS IV SOLN: INTRAVENOUS | Status: DC | PRN

## 2019-10-25 MED ORDER — METHOCARBAMOL 500 MG PO TABS
500.0000 mg | ORAL_TABLET | Freq: Four times a day (QID) | ORAL | Status: DC | PRN
  Administered 2019-10-25 – 2019-10-26 (×3): 500 mg via ORAL
  Filled 2019-10-25 (×4): qty 1

## 2019-10-25 MED ORDER — METHYLENE BLUE 0.5 % INJ SOLN
INTRAVENOUS | Status: AC
  Filled 2019-10-25: qty 10

## 2019-10-25 MED ORDER — DULOXETINE HCL 60 MG PO CPEP
60.0000 mg | ORAL_CAPSULE | Freq: Every day | ORAL | Status: DC
  Administered 2019-10-26: 60 mg via ORAL
  Filled 2019-10-25 (×2): qty 1

## 2019-10-25 MED ORDER — PHENOL 1.4 % MT LIQD: 1.0000 | OROMUCOSAL | Status: DC | PRN

## 2019-10-25 MED ORDER — THROMBIN 20000 UNITS EX SOLR
CUTANEOUS | Status: AC
  Filled 2019-10-25: qty 40000

## 2019-10-25 MED ORDER — DOCUSATE SODIUM 100 MG PO CAPS
100.0000 mg | ORAL_CAPSULE | Freq: Two times a day (BID) | ORAL | Status: DC
  Administered 2019-10-25 – 2019-10-26 (×2): 100 mg via ORAL
  Filled 2019-10-25 (×2): qty 1

## 2019-10-25 MED ORDER — POTASSIUM CHLORIDE IN NACL 20-0.9 MEQ/L-% IV SOLN: INTRAVENOUS | Status: DC

## 2019-10-25 MED ORDER — CEFAZOLIN SODIUM 1 G IJ SOLR
INTRAMUSCULAR | Status: AC
  Filled 2019-10-25: qty 20

## 2019-10-25 MED ORDER — EPINEPHRINE PF 1 MG/ML IJ SOLN
INTRAMUSCULAR | Status: AC
  Filled 2019-10-25: qty 1

## 2019-10-25 MED ORDER — MORPHINE SULFATE (PF) 2 MG/ML IV SOLN: 1.0000 mg | INTRAVENOUS | Status: DC | PRN

## 2019-10-25 MED ORDER — PROMETHAZINE HCL 25 MG/ML IJ SOLN
INTRAMUSCULAR | Status: AC
  Filled 2019-10-25: qty 1

## 2019-10-25 MED ORDER — LIDOCAINE 2% (20 MG/ML) 5 ML SYRINGE
INTRAMUSCULAR | Status: AC
  Filled 2019-10-25: qty 5

## 2019-10-25 MED ORDER — METHOCARBAMOL 500 MG PO TABS
ORAL_TABLET | ORAL | Status: AC
  Filled 2019-10-25: qty 1

## 2019-10-25 MED ORDER — BUPIVACAINE HCL (PF) 0.25 % IJ SOLN
INTRAMUSCULAR | Status: AC
  Filled 2019-10-25: qty 30

## 2019-10-25 MED ORDER — CEFAZOLIN SODIUM-DEXTROSE 2-4 GM/100ML-% IV SOLN
2.0000 g | INTRAVENOUS | Status: AC
  Administered 2019-10-25 (×2): 2 g via INTRAVENOUS

## 2019-10-25 MED ORDER — ACETAMINOPHEN 10 MG/ML IV SOLN
INTRAVENOUS | Status: DC | PRN
  Administered 2019-10-25: 1000 mg via INTRAVENOUS

## 2019-10-25 MED ORDER — STERILE WATER FOR IRRIGATION IR SOLN
Status: DC | PRN
  Administered 2019-10-25: 1000 mL

## 2019-10-25 MED ORDER — SUCCINYLCHOLINE CHLORIDE 20 MG/ML IJ SOLN
INTRAMUSCULAR | Status: DC | PRN
  Administered 2019-10-25: 120 mg via INTRAVENOUS

## 2019-10-25 MED ORDER — ACETAMINOPHEN 650 MG RE SUPP: 650.0000 mg | RECTAL | Status: DC | PRN

## 2019-10-25 MED ORDER — FLEET ENEMA 7-19 GM/118ML RE ENEM: 1.0000 | ENEMA | Freq: Once | RECTAL | Status: DC | PRN

## 2019-10-25 MED ORDER — ZOLPIDEM TARTRATE 5 MG PO TABS: 5.0000 mg | ORAL_TABLET | Freq: Every evening | ORAL | Status: DC | PRN

## 2019-10-25 MED ORDER — PHENYLEPHRINE HCL (PRESSORS) 10 MG/ML IV SOLN
INTRAVENOUS | Status: DC | PRN
  Administered 2019-10-25: 80 ug via INTRAVENOUS

## 2019-10-25 MED ORDER — BUPIVACAINE-EPINEPHRINE 0.25% -1:200000 IJ SOLN
INTRAMUSCULAR | Status: DC | PRN
  Administered 2019-10-25: 30 mL

## 2019-10-25 MED ORDER — ROCURONIUM BROMIDE 10 MG/ML (PF) SYRINGE
PREFILLED_SYRINGE | INTRAVENOUS | Status: AC
  Filled 2019-10-25: qty 10

## 2019-10-25 MED ORDER — KETAMINE HCL 10 MG/ML IJ SOLN
INTRAMUSCULAR | Status: DC | PRN
  Administered 2019-10-25: 20 mg via INTRAVENOUS
  Administered 2019-10-25: 10 mg via INTRAVENOUS
  Administered 2019-10-25: 20 mg via INTRAVENOUS

## 2019-10-25 MED ORDER — FENTANYL CITRATE (PF) 250 MCG/5ML IJ SOLN
INTRAMUSCULAR | Status: DC | PRN
  Administered 2019-10-25 (×7): 50 ug via INTRAVENOUS
  Administered 2019-10-25: 100 ug via INTRAVENOUS
  Administered 2019-10-25 (×6): 50 ug via INTRAVENOUS

## 2019-10-25 MED ORDER — BUPIVACAINE HCL 0.25 % IJ SOLN
INTRAMUSCULAR | Status: DC | PRN
  Administered 2019-10-25: 30 mL

## 2019-10-25 MED ORDER — KETAMINE HCL 50 MG/5ML IJ SOSY
PREFILLED_SYRINGE | INTRAMUSCULAR | Status: AC
  Filled 2019-10-25: qty 5

## 2019-10-25 MED ORDER — THROMBIN 20000 UNITS EX SOLR
CUTANEOUS | Status: DC | PRN
  Administered 2019-10-25: 20000 [IU] via TOPICAL

## 2019-10-25 MED ORDER — EPINEPHRINE PF 1 MG/ML IJ SOLN
INTRAMUSCULAR | Status: DC | PRN
  Administered 2019-10-25: 1 mg

## 2019-10-25 MED ORDER — ONDANSETRON HCL 4 MG/2ML IJ SOLN: 4.0000 mg | Freq: Four times a day (QID) | INTRAMUSCULAR | Status: DC | PRN

## 2019-10-25 MED ORDER — ROCURONIUM BROMIDE 10 MG/ML (PF) SYRINGE
PREFILLED_SYRINGE | INTRAVENOUS | Status: AC
  Filled 2019-10-25: qty 20

## 2019-10-25 MED ORDER — SENNOSIDES-DOCUSATE SODIUM 8.6-50 MG PO TABS
1.0000 | ORAL_TABLET | Freq: Every evening | ORAL | Status: DC | PRN
  Administered 2019-10-25: 1 via ORAL
  Filled 2019-10-25: qty 1

## 2019-10-25 MED ORDER — MENTHOL 3 MG MT LOZG: 1.0000 | LOZENGE | OROMUCOSAL | Status: DC | PRN

## 2019-10-25 MED ORDER — CEFAZOLIN SODIUM-DEXTROSE 2-4 GM/100ML-% IV SOLN
2.0000 g | Freq: Three times a day (TID) | INTRAVENOUS | Status: AC
  Administered 2019-10-25 – 2019-10-26 (×2): 2 g via INTRAVENOUS
  Filled 2019-10-25 (×2): qty 100

## 2019-10-25 MED ORDER — ONDANSETRON HCL 4 MG/2ML IJ SOLN
INTRAMUSCULAR | Status: AC
  Filled 2019-10-25: qty 2

## 2019-10-25 MED ORDER — FENTANYL CITRATE (PF) 250 MCG/5ML IJ SOLN
INTRAMUSCULAR | Status: AC
  Filled 2019-10-25: qty 5

## 2019-10-25 MED ORDER — PROPOFOL 10 MG/ML IV BOLUS
INTRAVENOUS | Status: DC | PRN
  Administered 2019-10-25: 150 mg via INTRAVENOUS

## 2019-10-25 MED ORDER — DEXMEDETOMIDINE HCL 200 MCG/2ML IV SOLN
INTRAVENOUS | Status: DC | PRN
  Administered 2019-10-25 (×8): 4 ug via INTRAVENOUS

## 2019-10-25 MED ORDER — 0.9 % SODIUM CHLORIDE (POUR BTL) OPTIME
TOPICAL | Status: DC | PRN
  Administered 2019-10-25: 1000 mL

## 2019-10-25 MED ORDER — FENTANYL CITRATE (PF) 100 MCG/2ML IJ SOLN
INTRAMUSCULAR | Status: AC
  Filled 2019-10-25: qty 2

## 2019-10-25 MED ORDER — LIDOCAINE 2% (20 MG/ML) 5 ML SYRINGE
INTRAMUSCULAR | Status: DC | PRN
  Administered 2019-10-25: 60 mg via INTRAVENOUS

## 2019-10-25 MED ORDER — METHOCARBAMOL 1000 MG/10ML IJ SOLN
500.0000 mg | Freq: Four times a day (QID) | INTRAVENOUS | Status: DC | PRN
  Filled 2019-10-25: qty 5

## 2019-10-25 MED ORDER — PROPOFOL 10 MG/ML IV BOLUS
INTRAVENOUS | Status: AC
  Filled 2019-10-25: qty 20

## 2019-10-25 MED ORDER — MIDAZOLAM HCL 2 MG/2ML IJ SOLN
INTRAMUSCULAR | Status: AC
  Filled 2019-10-25: qty 2

## 2019-10-25 MED ORDER — PHENYLEPHRINE 40 MCG/ML (10ML) SYRINGE FOR IV PUSH (FOR BLOOD PRESSURE SUPPORT)
PREFILLED_SYRINGE | INTRAVENOUS | Status: AC
  Filled 2019-10-25: qty 20

## 2019-10-25 MED ORDER — SODIUM CHLORIDE 0.9 % IV SOLN: 250.0000 mL | INTRAVENOUS | Status: DC

## 2019-10-25 MED ORDER — FENTANYL CITRATE (PF) 100 MCG/2ML IJ SOLN
25.0000 ug | INTRAMUSCULAR | Status: DC | PRN
  Administered 2019-10-25 (×2): 50 ug via INTRAVENOUS

## 2019-10-25 MED ORDER — BISACODYL 5 MG PO TBEC: 5.0000 mg | DELAYED_RELEASE_TABLET | Freq: Every day | ORAL | Status: DC | PRN

## 2019-10-25 MED ORDER — SUGAMMADEX SODIUM 200 MG/2ML IV SOLN
INTRAVENOUS | Status: DC | PRN
  Administered 2019-10-25: 200 mg via INTRAVENOUS

## 2019-10-25 MED ORDER — DEXAMETHASONE SODIUM PHOSPHATE 10 MG/ML IJ SOLN
INTRAMUSCULAR | Status: DC | PRN
  Administered 2019-10-25: 10 mg via INTRAVENOUS

## 2019-10-25 MED ORDER — CEFAZOLIN SODIUM-DEXTROSE 2-4 GM/100ML-% IV SOLN
INTRAVENOUS | Status: AC
  Filled 2019-10-25: qty 100

## 2019-10-25 MED ORDER — ONDANSETRON HCL 4 MG/2ML IJ SOLN
INTRAMUSCULAR | Status: DC | PRN
  Administered 2019-10-25: 4 mg via INTRAVENOUS

## 2019-10-25 MED ORDER — ACETAMINOPHEN 325 MG PO TABS: 650.0000 mg | ORAL_TABLET | ORAL | Status: DC | PRN

## 2019-10-25 MED ORDER — PHENYLEPHRINE HCL-NACL 10-0.9 MG/250ML-% IV SOLN
INTRAVENOUS | Status: DC | PRN
  Administered 2019-10-25: 15 ug/min via INTRAVENOUS

## 2019-10-25 MED ORDER — ALUM & MAG HYDROXIDE-SIMETH 200-200-20 MG/5ML PO SUSP: 30.0000 mL | Freq: Four times a day (QID) | ORAL | Status: DC | PRN

## 2019-10-25 MED ORDER — DEXAMETHASONE SODIUM PHOSPHATE 10 MG/ML IJ SOLN
INTRAMUSCULAR | Status: AC
  Filled 2019-10-25: qty 1

## 2019-10-25 MED ORDER — BUPIVACAINE LIPOSOME 1.3 % IJ SUSP
20.0000 mL | INTRAMUSCULAR | Status: AC
  Administered 2019-10-25: 20 mL
  Filled 2019-10-25: qty 20

## 2019-10-25 MED ORDER — HEMOSTATIC AGENTS (NO CHARGE) OPTIME
TOPICAL | Status: DC | PRN
  Administered 2019-10-25 (×2): 1

## 2019-10-25 MED ORDER — POVIDONE-IODINE 7.5 % EX SOLN
Freq: Once | CUTANEOUS | Status: DC
  Filled 2019-10-25: qty 118

## 2019-10-25 MED ORDER — PROPOFOL 500 MG/50ML IV EMUL
INTRAVENOUS | Status: DC | PRN
  Administered 2019-10-25: 25 ug/kg/min via INTRAVENOUS

## 2019-10-25 MED ORDER — PROMETHAZINE HCL 25 MG/ML IJ SOLN
6.2500 mg | Freq: Once | INTRAMUSCULAR | Status: AC
  Administered 2019-10-25: 6.25 mg via INTRAVENOUS

## 2019-10-25 MED ORDER — SODIUM CHLORIDE 0.9% FLUSH
3.0000 mL | Freq: Two times a day (BID) | INTRAVENOUS | Status: DC
  Administered 2019-10-25 (×2): 3 mL via INTRAVENOUS

## 2019-10-25 MED ORDER — OXYCODONE-ACETAMINOPHEN 5-325 MG PO TABS
1.0000 | ORAL_TABLET | ORAL | Status: DC | PRN
  Administered 2019-10-25 – 2019-10-26 (×4): 2 via ORAL
  Filled 2019-10-25 (×4): qty 2

## 2019-10-25 MED ORDER — METHYLENE BLUE 0.5 % INJ SOLN
INTRAVENOUS | Status: DC | PRN
  Administered 2019-10-25: 1 mL via INTRADERMAL

## 2019-10-25 MED ORDER — BUPIVACAINE LIPOSOME 1.3 % IJ SUSP
INTRAMUSCULAR | Status: DC | PRN
  Administered 2019-10-25: 20 mL

## 2019-10-25 SURGICAL SUPPLY — 123 items
APPLIER CLIP 11 MED OPEN (CLIP) ×4
BENZOIN TINCTURE PRP APPL 2/3 (GAUZE/BANDAGES/DRESSINGS) ×6 IMPLANT
BLADE CLIPPER SURG (BLADE) ×4 IMPLANT
BLADE SURG 10 STRL SS (BLADE) ×4 IMPLANT
BONE VIVIGEN FORMABLE 5.4CC (Bone Implant) ×4 IMPLANT
BUR PRESCISION 1.7 ELITE (BURR) ×4 IMPLANT
BUR ROUND FLUTED 5 RND (BURR) ×2 IMPLANT
BUR ROUND FLUTED 5MM RND (BURR)
BUR ROUND PRECISION 4.0 (BURR) IMPLANT
BUR ROUND PRECISION 4.0MM (BURR)
BUR SABER RD CUTTING 3.0 (BURR) ×1 IMPLANT
BUR SABER RD CUTTING 3.0MM (BURR) ×1
CARTRIDGE OIL MAESTRO DRILL (MISCELLANEOUS) ×2 IMPLANT
CLIP APPLIE 11 MED OPEN (CLIP) ×2 IMPLANT
CLOSURE STERI-STRIP 1/2X4 (GAUZE/BANDAGES/DRESSINGS) ×2
CLOSURE WOUND 1/2 X4 (GAUZE/BANDAGES/DRESSINGS) ×2
CLSR STERI-STRIP ANTIMIC 1/2X4 (GAUZE/BANDAGES/DRESSINGS) ×2 IMPLANT
CNTNR URN SCR LID CUP LEK RST (MISCELLANEOUS) ×2 IMPLANT
CONT SPEC 4OZ CLIKSEAL STRL BL (MISCELLANEOUS) ×2 IMPLANT
CONT SPEC 4OZ STRL OR WHT (MISCELLANEOUS) ×4
CORD BIPOLAR FORCEPS 12FT (ELECTRODE) ×6 IMPLANT
COVER MAYO STAND STRL (DRAPES) ×8 IMPLANT
COVER SURGICAL LIGHT HANDLE (MISCELLANEOUS) ×10 IMPLANT
COVER WAND RF STERILE (DRAPES) ×4 IMPLANT
DIFFUSER DRILL AIR PNEUMATIC (MISCELLANEOUS) ×2 IMPLANT
DRAIN CHANNEL 15F RND FF W/TCR (WOUND CARE) IMPLANT
DRAPE C-ARM 42X72 X-RAY (DRAPES) ×12 IMPLANT
DRAPE C-ARMOR (DRAPES) ×4 IMPLANT
DRAPE POUCH INSTRU U-SHP 10X18 (DRAPES) ×8 IMPLANT
DRAPE SURG 17X23 STRL (DRAPES) ×36 IMPLANT
DRSG MEPILEX BORDER 4X12 (GAUZE/BANDAGES/DRESSINGS) ×4 IMPLANT
DURAPREP 26ML APPLICATOR (WOUND CARE) ×10 IMPLANT
ELECT BLADE 4.0 EZ CLEAN MEGAD (MISCELLANEOUS) ×4
ELECT BLADE 6.5 EXT (BLADE) ×2 IMPLANT
ELECT CAUTERY BLADE 6.4 (BLADE) ×8 IMPLANT
ELECT REM PT RETURN 9FT ADLT (ELECTROSURGICAL) ×12
ELECTRODE BLDE 4.0 EZ CLN MEGD (MISCELLANEOUS) ×4 IMPLANT
ELECTRODE REM PT RTRN 9FT ADLT (ELECTROSURGICAL) ×4 IMPLANT
EVACUATOR SILICONE 100CC (DRAIN) IMPLANT
FEE INTRAOP MONITOR IMPULS NCS (MISCELLANEOUS) IMPLANT
FELT TEFLON 1X6 (MISCELLANEOUS) ×2 IMPLANT
FILTER STRAW FLUID ASPIR (MISCELLANEOUS) ×4 IMPLANT
GAUZE 4X4 16PLY RFD (DISPOSABLE) ×6 IMPLANT
GAUZE SPONGE 4X4 12PLY STRL (GAUZE/BANDAGES/DRESSINGS) ×4 IMPLANT
GAUZE SPONGE 4X4 12PLY STRL LF (GAUZE/BANDAGES/DRESSINGS) ×4 IMPLANT
GLOVE BIO SURGEON STRL SZ7 (GLOVE) ×12 IMPLANT
GLOVE BIO SURGEON STRL SZ8 (GLOVE) ×10 IMPLANT
GLOVE BIOGEL PI IND STRL 7.0 (GLOVE) ×4 IMPLANT
GLOVE BIOGEL PI IND STRL 8 (GLOVE) ×6 IMPLANT
GLOVE BIOGEL PI INDICATOR 7.0 (GLOVE) ×8
GLOVE BIOGEL PI INDICATOR 8 (GLOVE) ×8
GOWN STRL REUS W/ TWL LRG LVL3 (GOWN DISPOSABLE) ×8 IMPLANT
GOWN STRL REUS W/ TWL XL LVL3 (GOWN DISPOSABLE) ×4 IMPLANT
GOWN STRL REUS W/TWL LRG LVL3 (GOWN DISPOSABLE) ×32
GOWN STRL REUS W/TWL XL LVL3 (GOWN DISPOSABLE) ×16
GRAFT BNE MATRIX VG FRMBL MD 5 (Bone Implant) IMPLANT
GUIDEWIRE BLUNT VIPER II 1.45 (WIRE) ×8 IMPLANT
HEMOSTAT SURGICEL 2X14 (HEMOSTASIS) IMPLANT
INTRAOP MONITOR FEE IMPULS NCS (MISCELLANEOUS) ×2
INTRAOP MONITOR FEE IMPULSE (MISCELLANEOUS) ×4
IV CATH 14GX2 1/4 (CATHETERS) ×4 IMPLANT
KIT BASIN OR (CUSTOM PROCEDURE TRAY) ×8 IMPLANT
KIT POSITION SURG JACKSON T1 (MISCELLANEOUS) ×4 IMPLANT
KIT TURNOVER KIT B (KITS) ×8 IMPLANT
MARKER SKIN DUAL TIP RULER LAB (MISCELLANEOUS) ×8 IMPLANT
NDL 18GX1X1/2 (RX/OR ONLY) (NEEDLE) ×2 IMPLANT
NDL FILTER BLUNT 18X1 1/2 (NEEDLE) IMPLANT
NDL HYPO 25GX1X1/2 BEV (NEEDLE) ×4 IMPLANT
NDL SPNL 18GX3.5 QUINCKE PK (NEEDLE) ×6 IMPLANT
NEEDLE 18GX1X1/2 (RX/OR ONLY) (NEEDLE) ×8 IMPLANT
NEEDLE 22X1 1/2 (OR ONLY) (NEEDLE) ×8 IMPLANT
NEEDLE FILTER BLUNT 18X 1/2SAF (NEEDLE) ×4
NEEDLE FILTER BLUNT 18X1 1/2 (NEEDLE) ×4 IMPLANT
NEEDLE HYPO 25GX1X1/2 BEV (NEEDLE) ×8 IMPLANT
NEEDLE SPNL 18GX3.5 QUINCKE PK (NEEDLE) ×16 IMPLANT
NS IRRIG 1000ML POUR BTL (IV SOLUTION) ×10 IMPLANT
OIL CARTRIDGE MAESTRO DRILL (MISCELLANEOUS) ×4
PACK LAMINECTOMY ORTHO (CUSTOM PROCEDURE TRAY) ×8 IMPLANT
PACK UNIVERSAL I (CUSTOM PROCEDURE TRAY) ×10 IMPLANT
PAD ARMBOARD 7.5X6 YLW CONV (MISCELLANEOUS) ×16 IMPLANT
PATTIES SURGICAL .5 X1 (DISPOSABLE) ×4 IMPLANT
PATTIES SURGICAL .5X1.5 (GAUZE/BANDAGES/DRESSINGS) ×4 IMPLANT
PENCIL BUTTON HOLSTER BLD 10FT (ELECTRODE) ×2 IMPLANT
PROBE NEUROSIGN BIPOLAR (INSTRUMENTS) ×4 IMPLANT
ROD VIPER II LORDOSED 5.5X40 (Rod) ×4 IMPLANT
SCREW SET SINGLE INNER MIS (Screw) ×8 IMPLANT
SCREW XTAB POLY VIPER  7X45 (Screw) ×16 IMPLANT
SCREW XTAB POLY VIPER 7X45 (Screw) IMPLANT
SPACER ALIF EXP 26X34 15D (Spacer) ×2 IMPLANT
SPONGE INTESTINAL PEANUT (DISPOSABLE) ×14 IMPLANT
SPONGE LAP 18X18 RF (DISPOSABLE) ×2 IMPLANT
SPONGE SURGIFOAM ABS GEL 100 (HEMOSTASIS) ×10 IMPLANT
STRIP CLOSURE SKIN 1/2X4 (GAUZE/BANDAGES/DRESSINGS) ×6 IMPLANT
SURGIFLO W/THROMBIN 8M KIT (HEMOSTASIS) IMPLANT
SUT MNCRL AB 4-0 PS2 18 (SUTURE) ×10 IMPLANT
SUT PDS AB 1 CTX 36 (SUTURE) ×6 IMPLANT
SUT PROLENE 5 0 C 1 24 (SUTURE) IMPLANT
SUT PROLENE 5 0 C 1 36 (SUTURE) ×2 IMPLANT
SUT SILK 2 0 TIES 10X30 (SUTURE) ×4 IMPLANT
SUT SILK 3 0 TIES 10X30 (SUTURE) ×4 IMPLANT
SUT VIC AB 0 CT1 18XCR BRD 8 (SUTURE) ×2 IMPLANT
SUT VIC AB 0 CT1 8-18 (SUTURE) ×4
SUT VIC AB 1 CT1 18XCR BRD 8 (SUTURE) ×2 IMPLANT
SUT VIC AB 1 CT1 27 (SUTURE)
SUT VIC AB 1 CT1 27XBRD ANBCTR (SUTURE) ×4 IMPLANT
SUT VIC AB 1 CT1 8-18 (SUTURE) ×4
SUT VIC AB 1 CTX 36 (SUTURE)
SUT VIC AB 1 CTX36XBRD ANBCTR (SUTURE) ×4 IMPLANT
SUT VIC AB 2-0 CT2 18 VCP726D (SUTURE) ×10 IMPLANT
SYR 20ML LL LF (SYRINGE) ×8 IMPLANT
SYR BULB IRRIGATION 50ML (SYRINGE) ×8 IMPLANT
SYR CONTROL 10ML LL (SYRINGE) ×8 IMPLANT
SYR TB 1ML LUER SLIP (SYRINGE) ×10 IMPLANT
TAP CANN VIPER2 DL 6.0 (TAP) ×4 IMPLANT
TAP CANN VIPER2 DL 7.0 (TAP) ×4 IMPLANT
TAPE CLOTH SURG 4X10 WHT LF (GAUZE/BANDAGES/DRESSINGS) ×4 IMPLANT
TOWEL GREEN STERILE (TOWEL DISPOSABLE) ×6 IMPLANT
TOWEL GREEN STERILE FF (TOWEL DISPOSABLE) ×4 IMPLANT
TRAY FOLEY MTR SLVR 16FR STAT (SET/KITS/TRAYS/PACK) ×2 IMPLANT
TRAY FOLEY W/BAG SLVR 16FR (SET/KITS/TRAYS/PACK) ×4
TRAY FOLEY W/BAG SLVR 16FR ST (SET/KITS/TRAYS/PACK) ×2 IMPLANT
WATER STERILE IRR 1000ML POUR (IV SOLUTION) ×6 IMPLANT
YANKAUER SUCT BULB TIP NO VENT (SUCTIONS) ×8 IMPLANT

## 2019-10-25 NOTE — Transfer of Care (Signed)
Immediate Anesthesia Transfer of Care Note  Patient: Laura Cooper  Procedure(s) Performed: ANTERIOR LUMBAR INTERBODY FUSION LUMBAR 5 - SACRUM 1 WITH INSTRUMENTATION AND ALLOGRAFT (N/A Spine Lumbar) POSTERIOR SPINAL FUSION LUMBAR 5 - SACRUM 1 WITH INSTRUMENTATION AND ALLOGRAFT (N/A Spine Lumbar) Abdominal Exposure (N/A )  Patient Location: PACU  Anesthesia Type:General  Level of Consciousness: awake, alert  and oriented  Airway & Oxygen Therapy: Patient Spontanous Breathing  Post-op Assessment: Report given to RN and Post -op Vital signs reviewed and stable  Post vital signs: Reviewed and stable  Last Vitals:  Vitals Value Taken Time  BP 122/78 10/25/19 1359  Temp    Pulse 99 10/25/19 1403  Resp 18 10/25/19 1403  SpO2 99 % 10/25/19 1403  Vitals shown include unvalidated device data.  Last Pain:  Vitals:   10/25/19 0716  TempSrc:   PainSc: 0-No pain         Complications: No apparent anesthesia complications

## 2019-10-25 NOTE — H&P (Signed)
PREOPERATIVE H&P  Chief Complaint: Low back pain  HPI: Laura Cooper is a 24 y.o. female who presents with ongoing pain in the low back  MRI reveals severe DDD with a high grade 2 spondy  Patient has failed multiple forms of conservative care and continues to have pain (see office notes for additional details regarding the patient's full course of treatment)  Past Medical History:  Diagnosis Date  . Anxiety   . Depression   . Low back pain    Chronic Axial  . Spondylolisthesis at L5-S1 level    Past Surgical History:  Procedure Laterality Date  . wisdom     Social History   Socioeconomic History  . Marital status: Single    Spouse name: Not on file  . Number of children: Not on file  . Years of education: Not on file  . Highest education level: Not on file  Occupational History  . Not on file  Tobacco Use  . Smoking status: Current Every Day Smoker  . Smokeless tobacco: Never Used  Substance and Sexual Activity  . Alcohol use: Yes    Comment: once or twice a month   . Drug use: Yes    Types: Marijuana    Comment: Last used: daily   . Sexual activity: Not on file  Other Topics Concern  . Not on file  Social History Narrative  . Not on file   Social Determinants of Health   Financial Resource Strain:   . Difficulty of Paying Living Expenses:   Food Insecurity:   . Worried About Programme researcher, broadcasting/film/video in the Last Year:   . Barista in the Last Year:   Transportation Needs:   . Freight forwarder (Medical):   Marland Kitchen Lack of Transportation (Non-Medical):   Physical Activity:   . Days of Exercise per Week:   . Minutes of Exercise per Session:   Stress:   . Feeling of Stress :   Social Connections:   . Frequency of Communication with Friends and Family:   . Frequency of Social Gatherings with Friends and Family:   . Attends Religious Services:   . Active Member of Clubs or Organizations:   . Attends Banker Meetings:   Marland Kitchen  Marital Status:    No family history on file. No Known Allergies Prior to Admission medications   Medication Sig Start Date End Date Taking? Authorizing Provider  DULoxetine (CYMBALTA) 30 MG capsule Take 60 mg by mouth daily.  07/31/19  Yes [provider]  ibuprofen (ADVIL) 200 MG tablet Take 400 mg by mouth every 6 (six) hours as needed for moderate pain.   Yes [provider]  meloxicam (MOBIC) 7.5 MG tablet Take 7.5 mg by mouth daily.  06/21/19  Yes [provider]  medroxyPROGESTERone (DEPO-PROVERA) 150 MG/ML injection Inject 150 mg into the muscle every 3 (three) months.    [provider]     All other systems have been reviewed and were otherwise negative with the exception of those mentioned in the HPI and as above.  Physical Exam: Vitals:   10/25/19 0648  BP: (!) 121/47  Pulse: 82  Resp: 18  Temp: 98.6 F (37 C)  SpO2: 99%    Body mass index is 36.18 kg/m.  General: Alert, no acute distress Cardiovascular: No pedal edema Respiratory: No cyanosis, no use of accessory musculature Skin: No lesions in the area of chief complaint Neurologic: Sensation intact  distally Psychiatric: Patient is competent for consent with normal mood and affect Lymphatic: No axillary or cervical lymphadenopathy   Assessment/Plan: LUMBAR DDD Plan for Procedure(s): ANTERIOR LUMBAR INTERBODY FUSION LUMBAR 5 - SACRUM 1 WITH INSTRUMENTATION AND ALLOGRAFT POSTERIOR SPINAL FUSION LUMBAR 5 - SACRUM 1 WITH INSTRUMENTATION AND ALLOGRAFT   Norva Karvonen, MD 10/25/2019 7:16 AM

## 2019-10-25 NOTE — Evaluation (Signed)
Physical Therapy Evaluation Patient Details Name: Laura Cooper MRN: [id number] DOB: [birth year] Today's Date: 10/25/2019   History of Present Illness  pt is a 24 y/o female who presents with ongoing pain in the low back, s/p L5/S1 ALIF, PSA, ant/post fixation, with morselized allograft.  Clinical Impression  Pt is close to baseline functioning and should be safe at home with available help from family and significant other. There are no further acute PT needs.  Will sign off at this time.     Follow Up Recommendations No PT follow up    Equipment Recommendations  None recommended by PT    Recommendations for Other Services       Precautions / Restrictions Precautions Precautions: Back Required Braces or Orthoses: Spinal Brace Spinal Brace: Thoracolumbosacral orthotic;Applied in sitting position Restrictions Weight Bearing Restrictions: No      Mobility  Bed Mobility Overal bed mobility: Needs Assistance Bed Mobility: Rolling;Sidelying to Sit;Sit to Sidelying Rolling: Min guard Sidelying to sit: Supervision     Sit to sidelying: Supervision General bed mobility comments: Instruction in log roll/transition to/from sitting,  Practice of technique.  No assist needed  Transfers Overall transfer level: Needs assistance   Transfers: Sit to/from Stand Sit to Stand: Supervision            Ambulation/Gait Ambulation/Gait assistance: Supervision Gait Distance (Feet): 200 Feet Assistive device: None Gait Pattern/deviations: Step-through pattern   Gait velocity interpretation: <1.8 ft/sec, indicate of risk for recurrent falls General Gait Details: generally steady, mild steppage gait that improved with distance.  Stairs            Wheelchair Mobility    Modified Rankin (Stroke Patients Only)       Balance Overall balance assessment: No apparent balance deficits (not formally assessed)                                            Pertinent Vitals/Pain Pain Assessment: Faces Faces Pain Scale: Hurts even more Pain Location: back incision Pain Descriptors / Indicators: Discomfort;Numbness Pain Intervention(s): Monitored during session    Home Living Family/patient expects to be discharged to:: Private residence Living Arrangements: Spouse/significant other;Non-relatives/Friends Available Help at Discharge: Family;Available 24 hours/day(for a few days) Type of Home: Apartment Home Access: Level entry     Home Layout: One level Home Equipment: None      Prior Function Level of Independence: Independent               Hand Dominance        Extremity/Trunk Assessment   Upper Extremity Assessment Upper Extremity Assessment: Overall WFL for tasks assessed    Lower Extremity Assessment Lower Extremity Assessment: RLE deficits/detail RLE Deficits / Details: mild numbness, mild chronic weakness       Communication   Communication: No difficulties  Cognition Arousal/Alertness: Awake/alert Behavior During Therapy: WFL for tasks assessed/performed Overall Cognitive Status: Within Functional Limits for tasks assessed                                        General Comments General comments (skin integrity, edema, etc.): pt and family instructed in back pain/prec, log roll/transitions, bracing issues, lifting precautions, progression of activity.    Exercises     Assessment/Plan    PT Assessment Patent does  not need any further PT services  PT Problem List         PT Treatment Interventions      PT Goals (Current goals can be found in the Care Plan section)  Acute Rehab PT Goals PT Goal Formulation: All assessment and education complete, DC therapy    Frequency     Barriers to discharge        Co-evaluation               AM-PAC PT "6 Clicks" Mobility  Outcome Measure Help needed turning from your back to your side while in a flat bed without using  bedrails?: None Help needed moving from lying on your back to sitting on the side of a flat bed without using bedrails?: None Help needed moving to and from a bed to a chair (including a wheelchair)?: None Help needed standing up from a chair using your arms (e.g., wheelchair or bedside chair)?: None Help needed to walk in hospital room?: None Help needed climbing 3-5 steps with a railing? : A Little 6 Click Score: 23    End of Session Equipment Utilized During Treatment: Back brace Activity Tolerance: Patient tolerated treatment well;Patient limited by pain Patient left: in bed;with call bell/phone within reach;with family/visitor present Nurse Communication: Mobility status PT Visit Diagnosis: Other abnormalities of gait and mobility (R26.89);Pain Pain - part of body: (back)    Time: 7416-3845 PT Time Calculation (min) (ACUTE ONLY): 23 min   Charges:   PT Evaluation $PT Eval Low Complexity: 1 Low PT Treatments $Gait Training: 8-22 mins        10/25/2019  Ginger Carne., PT Acute Rehabilitation Services [phone]  (pager) [phone]  (office)  Tessie Fass [place] Molock 10/25/2019, 5:44 PM

## 2019-10-25 NOTE — Anesthesia Procedure Notes (Signed)
Arterial Line Insertion Start/End3/17/2021 8:30 AM, 10/25/2019 8:45 AM Performed by: Rosiland Oz, CRNA, CRNA  Patient location: OR. Preanesthetic checklist: patient identified, IV checked, site marked, risks and benefits discussed, surgical consent, monitors and equipment checked, pre-op evaluation, timeout performed and anesthesia consent Patient sedated Left, radial was placed Catheter size: 20 G Hand hygiene performed , maximum sterile barriers used  and Seldinger technique used  Attempts: 5 or more Procedure performed using ultrasound guided technique. Ultrasound Notes:anatomy identified, needle tip was noted to be adjacent to the nerve/plexus identified and no ultrasound evidence of intravascular and/or intraneural injection Following insertion, dressing applied and Biopatch. Post procedure assessment: normal and unchanged  Patient tolerated the procedure well with no immediate complications.

## 2019-10-25 NOTE — Op Note (Signed)
    Patient name: Laura Cooper MRN: [id number] DOB: 1996-06-02 Sex: female  10/25/2019 Pre-operative Diagnosis: Degenerative disc disease, L5-S1 Post-operative diagnosis:  Same Surgeon:  Durene Cal  Co-Surgeon: [name] Assistants:  Jason Coop Procedure:   Anterior exposure, L5-S1 Anesthesia: General Blood Loss: 100 cc Specimens: None  Findings: Normal anatomy  Indications: Anterior instrumentation was recommended by Dr. Harvest Forest.  I have been asked to provide anterior Mosier.  I saw the patient at her preoperative visit and discussed the risks and benefits.  All questions were answered.  Procedure:  The patient was identified in the holding area and taken to [facility] OR ROOM 05  The patient was then placed supine on the table. general anesthesia was administered.  The patient was prepped and draped in the usual sterile fashion.  A time out was called and antibiotics were administered.  Fluoroscopy was used to determine the appropriate level of skin incision.  A left lower quadrant transverse incision was made beginning in the midline and extending laterally.  Cautery was used about subcutaneous tissue down to the fascia which was opened with cautery.  Subfascial flaps were then raised.  I then entered the retroperitoneal space lateral to the rectus muscle.  A good plane in the retroperitoneum was developed and the peritoneum was mobilized to the right and superiorly.  Identified the iliac artery and vein and mobilized them anteriorly.  The ureter was identified and protected laterally.  Cephalad dissection was then performed bluntly until I identified the spine.  The median sacral vessels were ligated with cautery.  Blunt dissection was then used to expose the left and right lateral sides of the spine.  The [name] retractor was then set up.  A 200 reverse lip blade was placed on either side of the spine.  190 malleable blade was placed inferiorly and a 150 blade was placed superiorly.   A spinal needle was then placed in the disc space and fluoroscopy returned and confirmed that we were at the appropriate disc space.  At this point Dr. Yevette Edwards took over the procedure.  Please see his operative note for full details.   Disposition: To PACU stable.   [name], M.D., [facility] [place] Office: [phone] Pager:  [phone]

## 2019-10-25 NOTE — Op Note (Signed)
PATIENT NAME: Laura Cooper  MEDICAL RECORD NO.: [id number]  DATE OF BIRTH: 12-04-1995  DATE OF PROCEDURE: 10/25/2019   OPERATIVE REPORT   PREOPERATIVE DIAGNOSES:  1. Severe bilateral L5-S1 neuroforaminal stenosis  2. Severe chronic low back pain 3. Bilateral L5 pars defect  4. Grade 2 L5-S1 spondylolisthesis   POSTOPERATIVE DIAGNOSES:  1. Severe bilateral L5-S1 neuroforaminal stenosis  2. Severe chronic low back pain 3. Bilateral L5 pars defect  4. Grade 2 L5-S1 spondylolisthesis   PROCEDURE:  1. Anterior lumbar interbody fusion, L5-S1 (expsure peformed by Dr. Harold Barban)  2. Insertion of interbody device x1 (10 mm Medium Globus expandible intervertebral spacer)  3. L5-S1 posterior spinal fusion  4. Placement on anterior fixation, L5/S1 5. Placement of posterior instrumentation L5, S1, bilaterally  6. Intraoperative use of fluoroscopy.  7. Use of morselized allograft - Vivigen  8. Co-Surgeon with Dr. Trula Slade for anterior approach  SURGEON: Phylliss Bob, MD   ASSISTANT: Pricilla Holm, PA-C   ANESTHESIA: General endotracheal anesthesia.   COMPLICATIONS: None.   DISPOSITION: Stable.   ESTIMATED BLOOD LOSS: 100c   INDICATIONS FOR SURGERY: Briefly, Laura Cooper is a very pleasant 24-year-  old female, who has been having progressive pain in the low back, which did progress to her having pain in the bilateral legs as well. The patient's imaging studies are as outlined above. Given her progressive pain, and the severity of her neurologic compression, we did discuss proceeding with the surgery noted above. The patient was fully aware of the risks and limitations associated with surgery, and did wish to proceed.   OPERATIVE DETAILS: On 10/25/2019, the patient was brought to surgery  and general endotracheal anesthesia was administered. The patient was  placed supine on the hospital bed. At this point, the patient's abdomen was prepped and draped in the usual sterile fashion.  An anterior retroperitoneal approach was then performed by Dr. Harold Barban. Once the anterior lumbar spine was noted, we did focus our attention on the L5-S1 intervertebral space. I  then performed a thorough and complete L5-S1 intervertebral diskectomy  to the level of the posterior longitudinal ligament. There was very severe height loss, and very minimal disc material was noted, as the disc was noted to be severely degenerated. I was however able to perform a meticulous and thorough L5-S1 intervertebral discectomy. The intervertebral space was noted to be rather mobile. At this point, I placed a series of trials, and the most appropriately sized implant (21mm) was packed with Vivigen and tamped into the intervertebral space. The implant was then expanded gently by about 1 mm, and I was able to successfully partially restore neuroforaminal height, thereby decompressing the bilateral exiting L5 nerves. I was very pleased with the press-fit of the implant.  At this point, I did proceed with the anterior fixation aspect of the procedure.  I did use an awl at the anterior and superior aspect of the S1 vertebral body.  I then placed a vertebral body screw into the S1 vertebral body, with an anterior fixation device secured to the posterior aspect of the screw, in order to provide fixation across the L5-S1 intervertebral space.  Excellent purchase of the screw was noted, and I did feel that the fixation device did appropriately secure the L5-S1 intervertebral spacer. I did liberally use AP and lateral fluoroscopy to ensure that the implant and anterior fixation was in the appropriate position, and was very pleased with the radiographs. The wound was copiously irrigated. The fascia was closed using #  1 PDS. The subcutaneous layer was closed using 0 Vicryl followed by 2-0 Vicryl, and the skin was closed using 4-0 Monocryl. Benzoin and Steri-Strips were applied followed by sterile dressing.   At this point, the  patient was placed prone on a well-padded flat Jackson spinal frame, in anticipation of the posterior instrumentation and fusion procedure. Once again, a timeout procedure was performed, and once again, the back was prepped and draped in the usual sterile fashion. At this point, paramedian incisions were made on the right and left sides. The left-sided L5 transverse process and sacral ala was exposed and decorticated. The remainder of the allograft was packed into the posterior lateral gutter to help aid in the success of the fusion. At this point, I did advance Jamshidi's across the bilateral L5 and S1 pedicles. I then tapped the pedicles over the guidewires, using a 6 mm tap. I did use triggered EMG to test the taps, in order to ensure that the taps were not contacting any neurologic structures. There was no tap that tested below 12 mA. I then advanced 7 x 45 mm screws bilaterally into the L5 and S1 pedicles. 62mm interconnecting rods were then secured into the tulip heads of the screws. Caps were then placed followed by final locking procedure. Once again, I was very pleased with the intraoperative fluoroscopic images. All instrument counts were correct at the termination of the procedure. I did use neurologic monitoring throughout the surgery, and there  was no abnormal EMG activity noted throughout the surgery.   Of note, Jason Coop was my assistant throughout surgery, and did aid  in retraction, suctioning, placement of the hardware, and closure for both the anterior and posterior portions of the procedure.   [name], MD

## 2019-10-25 NOTE — Consult Note (Signed)
[facility] [place]  Patient name: Laura Cooper  MRN: [id number]        DOB: 11-19-1995          Sex: female   REQUESTING PROVIDER:    Dr. Yevette Edwards   REASON FOR CONSULT:    Degenerative back disease  HISTORY OF PRESENT ILLNESS:   Laura Cooper is a 24 y.o. female, who is referred for consideration of anterior exposure for L5-S1 instrumentation.  She denies any prior abdominal surgery.  She does have a history of smoking and vaping.  PAST MEDICAL HISTORY        Past Medical History:  Diagnosis Date  . Anxiety   . Low back pain    Chronic Axial  . Spondylolisthesis at L5-S1 level      FAMILY HISTORY   History reviewed. No pertinent family history.  SOCIAL HISTORY:   Social History        Socioeconomic History  . Marital status: Single    Spouse name: Not on file  . Number of children: Not on file  . Years of education: Not on file  . Highest education level: Not on file  Occupational History  . Not on file  Tobacco Use  . Smoking status: Current Every Day Smoker  . Smokeless tobacco: Never Used  Substance and Sexual Activity  . Alcohol use: Yes    Comment: once or twice a month   . Drug use: Yes    Types: Marijuana    Comment: Last used: daily   . Sexual activity: Not on file  Other Topics Concern  . Not on file  Social History Narrative  . Not on file   Social Determinants of Health      Financial Resource Strain:   . Difficulty of Paying Living Expenses: Not on file  Food Insecurity:   . Worried About Programme researcher, broadcasting/film/video in the Last Year: Not on file  . Ran Out of Food in the Last Year: Not on file  Transportation Needs:   . Lack of Transportation (Medical): Not on file  . Lack of Transportation (Non-Medical): Not on file  Physical Activity:   . Days of Exercise per Week: Not on file  . Minutes of Exercise per Session: Not on file  Stress:   .  Feeling of Stress : Not on file  Social Connections:   . Frequency of Communication with Friends and Family: Not on file  . Frequency of Social Gatherings with Friends and Family: Not on file  . Attends Religious Services: Not on file  . Active Member of Clubs or Organizations: Not on file  . Attends Banker Meetings: Not on file  . Marital Status: Not on file  Intimate Partner Violence:   . Fear of Current or Ex-Partner: Not on file  . Emotionally Abused: Not on file  . Physically Abused: Not on file  . Sexually Abused: Not on file    ALLERGIES:    No Known Allergies  CURRENT MEDICATIONS:          Current Outpatient Medications  Medication Sig Dispense Refill  . Aspirin-Acetaminophen-Caffeine (GOODY HEADACHE PO) Take 1 packet by mouth as needed (back pain).    . DULoxetine (CYMBALTA) 30 MG capsule     . medroxyPROGESTERone (DEPO-PROVERA) 150 MG/ML injection Inject 150 mg into the muscle every 3 (three) months.    . meloxicam (MOBIC) 7.5 MG tablet Take by mouth.    . methocarbamol (  ROBAXIN) 500 MG tablet Take 500 mg by mouth every 6 (six) hours as needed.    . traMADol (ULTRAM) 50 MG tablet Take 50 mg by mouth 3 (three) times daily as needed.     No current facility-administered medications for this visit.    REVIEW OF SYSTEMS:   [X]  denotes positive finding, [ ]  denotes negative finding Cardiac  Comments:  Chest pain or chest pressure:    Shortness of breath upon exertion:    Short of breath when lying flat:    Irregular heart rhythm:        Vascular    Pain in calf, thigh, or hip brought on by ambulation:    Pain in feet at night that wakes you up from your sleep:     Blood clot in your veins:    Leg swelling:         Pulmonary    Oxygen at home:    Productive cough:     Wheezing:         Neurologic    Sudden weakness in arms or legs:     Sudden numbness in arms or legs:       Sudden onset of difficulty speaking or slurred speech:    Temporary loss of vision in one eye:     Problems with dizziness:         Gastrointestinal    Blood in stool:      Vomited blood:         Genitourinary    Burning when urinating:     Blood in urine:        Psychiatric    Major depression:         Hematologic    Bleeding problems:    Problems with blood clotting too easily:        Skin    Rashes or ulcers:        Constitutional    Fever or chills:     PHYSICAL EXAM:      Vitals:   09/04/19 1037  BP: 130/72  Pulse: 88  Resp: 20  Temp: 97.7 F (36.5 C)  SpO2: 97%  Weight: 244 lb (110.7 kg)  Height: 5\' 9"  (1.753 m)    GENERAL: The patient is a well-nourished female, in no acute distress. The vital signs are documented above. CARDIAC: There is a regular rate and rhythm.  PULMONARY: Nonlabored respirations ABDOMEN: Soft and non-tender  MUSCULOSKELETAL: There are no major deformities or cyanosis. NEUROLOGIC: No focal weakness or paresthesias are detected. SKIN: There are no ulcers or rashes noted. PSYCHIATRIC: The patient has a normal affect.  STUDIES:   I have reviewed her back x-rays as well as her lumbar spine MRI L3-4: There is a shallow central protrusion and annular fissure without central canal or foraminal narrowing.  L4-5: Very shallow disc bulge and a small annular fissure are identified. The central canal and foramina are patent.  L5-S1: The disc is uncovered with a shallow bulge. The central canal is widely patent. Anterolisthesis and disc cause right foraminal narrowing. The exiting right L5 root appears compressed in the foramen. The left foramen is open. ASSESSMENT and PLAN   Degenerative back disease, L5-S1.  I discussed the details of my portion of the procedure including the incision and exposure.  We discussed the risk of injury to the iliac artery and vein as well as  the ureter.  We discussed the possibility of bleeding, wound infection, and hernia as well as  GI issues.  All her questions were answered.   Leia Alf, MD, FACS [facility] [phone] Pager [phone]

## 2019-10-25 NOTE — Anesthesia Postprocedure Evaluation (Signed)
Anesthesia Post Note  Patient: RHEA KAELIN  Procedure(s) Performed: ANTERIOR LUMBAR INTERBODY FUSION LUMBAR 5 - SACRUM 1 WITH INSTRUMENTATION AND ALLOGRAFT (N/A Spine Lumbar) POSTERIOR SPINAL FUSION LUMBAR 5 - SACRUM 1 WITH INSTRUMENTATION AND ALLOGRAFT (N/A Spine Lumbar) Abdominal Exposure (N/A )     Patient location during evaluation: PACU Anesthesia Type: General Level of consciousness: awake Pain management: pain level controlled Vital Signs Assessment: post-procedure vital signs reviewed and stable Respiratory status: spontaneous breathing Cardiovascular status: stable Postop Assessment: no apparent nausea or vomiting Anesthetic complications: no    Last Vitals:  Vitals:   10/25/19 1500 10/25/19 1515  BP: 120/68 121/71  Pulse: 85 85  Resp: 16 20  Temp:    SpO2: 97% 97%    Last Pain:  Vitals:   10/25/19 1500  TempSrc:   PainSc: 5                  Dalphine Cowie

## 2019-10-25 NOTE — Anesthesia Procedure Notes (Signed)
Procedure Name: Intubation Date/Time: 10/25/2019 8:50 AM Performed by: Dairl Ponder, CRNA Pre-anesthesia Checklist: Patient identified, Emergency Drugs available, Suction available, Patient being monitored and Timeout performed Patient Re-evaluated:Patient Re-evaluated prior to induction Oxygen Delivery Method: Circle system utilized Preoxygenation: Pre-oxygenation with 100% oxygen Induction Type: IV induction Ventilation: Mask ventilation without difficulty Laryngoscope Size: Miller and 2 Grade View: Grade I Tube type: Oral Tube size: 7.0 mm Number of attempts: 1 Airway Equipment and Method: Stylet Placement Confirmation: ETT inserted through vocal cords under direct vision,  positive ETCO2 and breath sounds checked- equal and bilateral Secured at: 21 cm Tube secured with: Tape Dental Injury: Teeth and Oropharynx as per pre-operative assessment

## 2019-10-26 ENCOUNTER — Encounter (HOSPITAL_COMMUNITY): Payer: Self-pay | Admitting: Orthopedic Surgery

## 2019-10-26 MED ORDER — OXYCODONE-ACETAMINOPHEN 5-325 MG PO TABS: 1.0000 | ORAL_TABLET | ORAL | 0 refills | Status: AC | PRN

## 2019-10-26 MED ORDER — METHOCARBAMOL 500 MG PO TABS: 500.0000 mg | ORAL_TABLET | Freq: Four times a day (QID) | ORAL | 2 refills | Status: AC | PRN

## 2019-10-26 MED FILL — Heparin Sodium (Porcine) Inj 1000 Unit/ML: INTRAMUSCULAR | Qty: 30 | Status: AC

## 2019-10-26 MED FILL — Sodium Chloride IV Soln 0.9%: INTRAVENOUS | Qty: 1000 | Status: AC

## 2019-10-26 NOTE — Progress Notes (Signed)
  Progress Note    10/26/2019 9:26 AM 1 Day Post-Op  Subjective:  No major complaints. Eager to go home. Expected soreness. Has been up ambulating in the hall overnight   Vitals:   10/26/19 0425 10/26/19 0732  BP: 109/63 (!) 124/54  Pulse: 73 (!) 116  Resp: 20 18  Temp: 98.6 F (37 C) 97.8 F (36.6 C)  SpO2: 100% 100%   Physical Exam: General: well nourished, appears comfortable, not in any distress Lungs:  Non labored Incisions:  Dressings clean, dry, and intact Extremities:  Moving all extremities. Palpable DP and PT pulses bilaterally Abdomen:  Soft, non tender. Dressings intact Neurologic: Alert and oriented  CBC    Component Value Date/Time   WBC 5.9 10/17/2019 0854   RBC 4.84 10/17/2019 0854   HGB 14.4 10/17/2019 0854   HCT 44.2 10/17/2019 0854   PLT 195 10/17/2019 0854   MCV 91.3 10/17/2019 0854   MCH 29.8 10/17/2019 0854   MCHC 32.6 10/17/2019 0854   RDW 12.5 10/17/2019 0854   LYMPHSABS 1.8 10/17/2019 0854   MONOABS 0.4 10/17/2019 0854   EOSABS 0.1 10/17/2019 0854   BASOSABS 0.1 10/17/2019 0854    BMET    Component Value Date/Time   NA 142 10/17/2019 0854   K 4.3 10/17/2019 0854   CL 111 10/17/2019 0854   CO2 24 10/17/2019 0854   GLUCOSE 88 10/17/2019 0854   BUN 13 10/17/2019 0854   CREATININE 0.93 10/17/2019 0854   CALCIUM 9.2 10/17/2019 0854   GFRNONAA >60 10/17/2019 0854   GFRAA >60 10/17/2019 0854    INR    Component Value Date/Time   INR 1.0 10/17/2019 0854     Intake/Output Summary (Last 24 hours) at 10/26/2019 0926 Last data filed at 10/25/2019 1321 Gross per 24 hour  Intake 2850 ml  Output 500 ml  Net 2350 ml     Assessment/Plan:  24 y.o. female is s/p Anterior exposure L5-S1 1 Day Post-Op. Doing well following Anterior exposure L5-S1 by Dr. Myra Gianotti. Pain well controlled. Up mobilizing. Will discharge home today. Follow up with vascular as needed if any post operative issues arrise  Graceann Congress, PA-C [facility] [phone] 10/26/2019 9:26 AM

## 2019-10-26 NOTE — Progress Notes (Signed)
    Patient doing well  Patient notes expected low back pain, denies leg pain   Physical Exam: Vitals:   10/25/19 2330 10/26/19 0425  BP: 116/60 109/63  Pulse: 75 73  Resp: 20 20  Temp: 98.9 F (37.2 C) 98.6 F (37 C)  SpO2: 99% 100%   Patient looks excellent Dressing in place NVI  POD #1 s/p L5/S1 A/P fusion, doing very well  - up with PT/OT, encourage ambulation - Percocet for pain, Robaxin for muscle spasms - d/c home today with f/u in 2 weeks

## 2019-10-26 NOTE — Progress Notes (Signed)
Occupational Therapy Evaluation Patient Details Name: Laura Cooper MRN: [id number] DOB: 23-Jan-1996 Today's Date: 10/26/2019    History of Present Illness pt is a 24 y/o female who presents with ongoing pain in the low back, s/p L5/S1 ALIF, PSA, ant/post fixation, with morselized allograft.   Clinical Impression   Completed all education regarding management of ADL and functional mobility for ADL while adhering to back precautions. Pt able to return demonstrate. No further OT needs.     Follow Up Recommendations  No OT follow up    Equipment Recommendations  None recommended by OT    Recommendations for Other Services       Precautions / Restrictions Precautions Precautions: Back Precaution Booklet Issued: Yes (comment) Required Braces or Orthoses: Spinal Brace Spinal Brace: Thoracolumbosacral orthotic;Applied in sitting position Restrictions Weight Bearing Restrictions: No      Mobility Bed Mobility Overal bed mobility: Modified Independent             General bed mobility comments: good return demonstration  Transfers Overall transfer level: Modified independent                    Balance Overall balance assessment: No apparent balance deficits (not formally assessed)                                         ADL either performed or assessed with clinical judgement   ADL Overall ADL's : Needs assistance/impaired                                     Functional mobility during ADLs: Modified independent General ADL Comments: Educated pt on compensatory techniques and use of AE/DME for ADL adn functional mobility. Pt able to complete figure four positioning for LB ADL. Educated on need to use shower seat of use of long handled sponge and for bathing in ordering to maintain precautions. Pt verbalized understanding. Educated pt on use of toilet tongs for pericare if pt unable to reach. Educated on proper set up of  household items to increase adherance to back precautions. Pt verbalized understanding. Pt states she can borrow a shower chair from her Grandma if needed. Completed education regarding grooming @ sink level. Pt independent with donning/doffing back brace.      Vision         Perception     Praxis      Pertinent Vitals/Pain Pain Assessment: 0-10 Pain Score: 3  Pain Location: back incision Pain Descriptors / Indicators: Discomfort;Sore Pain Intervention(s): Limited activity within patient's tolerance     Hand Dominance Right   Extremity/Trunk Assessment Upper Extremity Assessment Upper Extremity Assessment: Defer to OT evaluation   Lower Extremity Assessment Lower Extremity Assessment: Defer to PT evaluation RLE Deficits / Details: mild numbness, mild chronic weakness   Cervical / Trunk Assessment Cervical / Trunk Assessment: Other exceptions(back surgery)   Communication Communication Communication: No difficulties   Cognition Arousal/Alertness: Awake/alert Behavior During Therapy: WFL for tasks assessed/performed Overall Cognitive Status: Within Functional Limits for tasks assessed                                     General Comments       Exercises  Shoulder Instructions      Home Living Family/patient expects to be discharged to:: Private residence Living Arrangements: Spouse/significant other;Non-relatives/Friends Available Help at Discharge: Family;Available 24 hours/day(for a few days) Type of Home: Apartment Home Access: Level entry     Home Layout: One level     Bathroom Shower/Tub: Chief Strategy Officer: Standard Bathroom Accessibility: No   Home Equipment: None(Can borrow a shower seat from [name])          Prior Functioning/Environment Level of Independence: Independent                 OT Problem List: Decreased knowledge of use of DME or AE;Decreased knowledge of precautions;Obesity;Pain       OT Treatment/Interventions:      OT Goals(Current goals can be found in the care plan section) Acute Rehab OT Goals Patient Stated Goal: to go home OT Goal Formulation: All assessment and education complete, DC therapy  OT Frequency:     Barriers to D/C:            Co-evaluation              AM-PAC OT "6 Clicks" Daily Activity     Outcome Measure Help from another person eating meals?: None Help from another person taking care of personal grooming?: None Help from another person toileting, which includes using toliet, bedpan, or urinal?: A Little Help from another person bathing (including washing, rinsing, drying)?: A Little Help from another person to put on and taking off regular upper body clothing?: None Help from another person to put on and taking off regular lower body clothing?: A Little 6 Click Score: 21   End of Session Equipment Utilized During Treatment: Back brace  Activity Tolerance: Patient tolerated treatment well Patient left: in bed;with call bell/phone within reach  OT Visit Diagnosis: Pain Pain - part of body: (back)                Time: 0350-0938 OT Time Calculation (min): 23 min Charges:  OT General Charges $OT Visit: 1 Visit OT Evaluation $OT Eval Low Complexity: 1 Low OT Treatments $Self Care/Home Management : 8-22 mins  Luisa Dago, OT/L   Acute OT Clinical Specialist Acute Rehabilitation Services Pager [phone] Office [phone]   [facility] 10/26/2019, 9:55 AM

## 2019-10-26 NOTE — Progress Notes (Signed)
Patient is discharged from room 3C06 at this time. Alert and in stable condition. IV site d/c'd and instructions read to patient with understanding verbalized and all questions answered. Left unit via wheelchair with all belongings at side.  

## 2019-10-31 NOTE — Discharge Summary (Signed)
Patient ID: LAURYN LIZARDI MRN: [id number] DOB/AGE: 12-Apr-1996 24 y.o.  Admit date: 10/25/2019 Discharge date: 10/26/2019  Admission Diagnoses:  Active Problems:   Radiculopathy   Discharge Diagnoses:  Same  Past Medical History:  Diagnosis Date  . Anxiety   . Depression   . Low back pain    Chronic Axial  . Spondylolisthesis at L5-S1 level     Surgeries: Procedure(s): ANTERIOR LUMBAR INTERBODY FUSION LUMBAR 5 - SACRUM 1 WITH INSTRUMENTATION AND ALLOGRAFT POSTERIOR SPINAL FUSION LUMBAR 5 - SACRUM 1 WITH INSTRUMENTATION AND ALLOGRAFT Abdominal Exposure on 10/25/2019   Consultants: Treatment Team:  Serafina Mitchell, MD  Discharged Condition: Improved  Hospital Course: DOROTHY POLHEMUS is an 24 y.o. female who was admitted 10/25/2019 for operative treatment of radiculopathy. Patient has severe unremitting pain that affects sleep, daily activities, and work/hobbies. After pre-op clearance the patient was taken to the operating room on 10/25/2019 and underwent  Procedure(s): ANTERIOR LUMBAR INTERBODY FUSION LUMBAR 5 - SACRUM 1 WITH INSTRUMENTATION AND ALLOGRAFT POSTERIOR SPINAL FUSION LUMBAR 5 - SACRUM 1 WITH INSTRUMENTATION AND ALLOGRAFT Abdominal Exposure.    Patient was given perioperative antibiotics:  Anti-infectives (From admission, onward)   Start     Dose/Rate Route Frequency Ordered Stop   10/25/19 2100  ceFAZolin (ANCEF) IVPB 2g/100 mL premix     2 g 200 mL/hr over 30 Minutes Intravenous Every 8 hours 10/25/19 1543 10/26/19 0425   10/25/19 0715  ceFAZolin (ANCEF) IVPB 2g/100 mL premix     2 g 200 mL/hr over 30 Minutes Intravenous On call to O.R. 10/25/19 0704 10/25/19 1300   10/25/19 0711  ceFAZolin (ANCEF) 2-4 GM/100ML-% IVPB    Note to Pharmacy: Cordelia Pen   : cabinet override      10/25/19 0711 10/25/19 0933       Patient was given sequential compression devices, early ambulation to prevent DVT.  Patient benefited maximally from hospital  stay and there were no complications.    Recent vital signs: BP (!) 124/54 (BP Location: Right Arm)   Pulse (!) 116   Temp 97.8 F (36.6 C) (Oral)   Resp 18   Ht 5\' 9"  (1.753 m)   Wt 111.1 kg   SpO2 100%   BMI 36.18 kg/m    Discharge Medications:   Allergies as of 10/26/2019   No Known Allergies     Medication List    TAKE these medications   DULoxetine 30 MG capsule Commonly known as: CYMBALTA Take 60 mg by mouth daily.   medroxyPROGESTERone 150 MG/ML injection Commonly known as: DEPO-PROVERA Inject 150 mg into the muscle every 3 (three) months.   methocarbamol 500 MG tablet Commonly known as: ROBAXIN Take 1 tablet (500 mg total) by mouth every 6 (six) hours as needed for muscle spasms.   oxyCODONE-acetaminophen 5-325 MG tablet Commonly known as: PERCOCET/ROXICET Take 1-2 tablets by mouth every 4 (four) hours as needed for moderate pain or severe pain.       Diagnostic Studies: DG Lumbar Spine 2-3 Views  Result Date: 10/25/2019 CLINICAL DATA:  Intraoperative imaging for L5-S1 fusion. EXAM: LUMBAR SPINE - 2-3 VIEW; DG C-ARM 1-60 MIN COMPARISON:  Single view of the pelvis earlier today. FINDINGS: Pedicle screws, stabilization bars and interbody spacer are seen at L5-S1 and there is a screw from an anterior approach in S1. Hardware is intact. No acute abnormality is identified. IMPRESSION: Intraoperative imaging for L5-S1 fusion. Electronically Signed   By: Inge Rise M.D.  On: 10/25/2019 14:10   DG C-Arm 1-60 Min  Result Date: 10/25/2019 CLINICAL DATA:  Intraoperative imaging for L5-S1 fusion. EXAM: LUMBAR SPINE - 2-3 VIEW; DG C-ARM 1-60 MIN COMPARISON:  Single view of the pelvis earlier today. FINDINGS: Pedicle screws, stabilization bars and interbody spacer are seen at L5-S1 and there is a screw from an anterior approach in S1. Hardware is intact. No acute abnormality is identified. IMPRESSION: Intraoperative imaging for L5-S1 fusion. Electronically Signed   By:  Drusilla Kanner M.D.   On: 10/25/2019 14:10   DG OR LOCAL ABDOMEN  Result Date: 10/25/2019 CLINICAL DATA:  Possible foreign body.  Instrument count. EXAM: OR LOCAL ABDOMEN COMPARISON:  None. FINDINGS: Changes of anterior fusion at L5-S1. No visible unexpected radiopaque foreign body or surgical instrument. IMPRESSION: No visible unexpected radiopaque foreign body or surgical instrument. These results were called by telephone at the time of interpretation on 10/25/2019 at 12:16 pm to the OR, who verbally acknowledged these results. Electronically Signed   By: Charlett Nose M.D.   On: 10/25/2019 12:16    Disposition: Discharge disposition: 01-Home or Self Care        POD #1 s/p L5/S1 A/P fusion, doing very well  - up with PT/OT, encourage ambulation - Percocet for pain, Robaxin for muscle spasms - d/c home today with f/u in 2 weeks  Signed: Georga Bora 10/31/2019, 12:52 PM

## 2019-11-20 ENCOUNTER — Ambulatory Visit
Admission: RE | Admit: 2019-11-20 | Discharge: 2019-11-20 | Disposition: A | Discharge status: Home / Self Care | Payer: BC Managed Care – PPO | Source: Ambulatory Visit | Attending: Orthopedic Surgery | Admitting: Orthopedic Surgery

## 2019-11-20 ENCOUNTER — Other Ambulatory Visit: Payer: Self-pay | Admitting: Orthopedic Surgery

## 2019-11-20 DIAGNOSIS — M545 Low back pain, unspecified: Secondary | ICD-10-CM

## 2021-08-22 DIAGNOSIS — M5416 Radiculopathy, lumbar region: Secondary | ICD-10-CM | POA: Diagnosis not present

## 2021-08-22 DIAGNOSIS — M545 Low back pain, unspecified: Secondary | ICD-10-CM | POA: Diagnosis not present

## 2021-09-01 ENCOUNTER — Other Ambulatory Visit: Payer: Self-pay | Admitting: Orthopedic Surgery

## 2021-09-01 DIAGNOSIS — M545 Low back pain, unspecified: Secondary | ICD-10-CM

## 2021-09-03 ENCOUNTER — Other Ambulatory Visit: Payer: Self-pay | Admitting: Orthopedic Surgery

## 2021-09-03 DIAGNOSIS — M545 Low back pain, unspecified: Secondary | ICD-10-CM

## 2021-09-12 ENCOUNTER — Ambulatory Visit
Admission: RE | Admit: 2021-09-12 | Discharge: 2021-09-12 | Disposition: A | Discharge status: Home / Self Care | Payer: 59 | Source: Ambulatory Visit | Attending: Orthopedic Surgery | Admitting: Orthopedic Surgery

## 2021-09-12 ENCOUNTER — Other Ambulatory Visit: Payer: Self-pay

## 2021-09-12 DIAGNOSIS — M4327 Fusion of spine, lumbosacral region: Secondary | ICD-10-CM | POA: Diagnosis not present

## 2021-09-12 DIAGNOSIS — M545 Low back pain, unspecified: Secondary | ICD-10-CM | POA: Diagnosis not present

## 2021-09-12 DIAGNOSIS — Z01818 Encounter for other preprocedural examination: Secondary | ICD-10-CM | POA: Diagnosis not present

## 2021-09-22 ENCOUNTER — Other Ambulatory Visit: Payer: Self-pay | Admitting: Orthopedic Surgery

## 2021-09-22 DIAGNOSIS — M545 Low back pain, unspecified: Secondary | ICD-10-CM

## 2021-09-24 ENCOUNTER — Ambulatory Visit
Admission: RE | Admit: 2021-09-24 | Discharge: 2021-09-24 | Disposition: A | Discharge status: Home / Self Care | Payer: 59 | Source: Ambulatory Visit | Attending: Orthopedic Surgery | Admitting: Orthopedic Surgery

## 2021-09-24 DIAGNOSIS — M545 Low back pain, unspecified: Secondary | ICD-10-CM | POA: Diagnosis not present

## 2021-09-24 DIAGNOSIS — M4327 Fusion of spine, lumbosacral region: Secondary | ICD-10-CM | POA: Diagnosis not present

## 2021-09-24 DIAGNOSIS — R200 Anesthesia of skin: Secondary | ICD-10-CM | POA: Diagnosis not present

## 2021-09-24 DIAGNOSIS — M79604 Pain in right leg: Secondary | ICD-10-CM | POA: Diagnosis not present

## 2021-09-24 DIAGNOSIS — M4326 Fusion of spine, lumbar region: Secondary | ICD-10-CM | POA: Diagnosis not present

## 2021-10-06 DIAGNOSIS — M545 Low back pain, unspecified: Secondary | ICD-10-CM | POA: Diagnosis not present

## 2022-01-15 DIAGNOSIS — Z7251 High risk heterosexual behavior: Secondary | ICD-10-CM | POA: Diagnosis not present

## 2022-01-15 DIAGNOSIS — R69 Illness, unspecified: Secondary | ICD-10-CM | POA: Diagnosis not present

## 2022-01-30 DIAGNOSIS — Z3202 Encounter for pregnancy test, result negative: Secondary | ICD-10-CM | POA: Diagnosis not present

## 2022-01-30 DIAGNOSIS — Z3042 Encounter for surveillance of injectable contraceptive: Secondary | ICD-10-CM | POA: Diagnosis not present

## 2022-04-23 DIAGNOSIS — Z3042 Encounter for surveillance of injectable contraceptive: Secondary | ICD-10-CM | POA: Diagnosis not present

## 2022-07-21 DIAGNOSIS — Z3042 Encounter for surveillance of injectable contraceptive: Secondary | ICD-10-CM | POA: Diagnosis not present

## 2023-11-02 DIAGNOSIS — M51362 Other intervertebral disc degeneration, lumbar region with discogenic back pain and lower extremity pain: Secondary | ICD-10-CM | POA: Diagnosis not present

## 2023-11-02 DIAGNOSIS — M62830 Muscle spasm of back: Secondary | ICD-10-CM | POA: Diagnosis not present

## 2023-11-02 DIAGNOSIS — M461 Sacroiliitis, not elsewhere classified: Secondary | ICD-10-CM | POA: Diagnosis not present

## 2023-11-15 DIAGNOSIS — M545 Low back pain, unspecified: Secondary | ICD-10-CM | POA: Diagnosis not present

## 2023-11-15 DIAGNOSIS — M533 Sacrococcygeal disorders, not elsewhere classified: Secondary | ICD-10-CM | POA: Diagnosis not present

## 2023-11-16 IMAGING — CT CT L SPINE W/O CM
3 of 4 series · 12 of 33 positions shown, 14 images · non-contrast
Comparison: Lumbar spine MRI 09/12/2021

CLINICAL DATA: Low back pain. Right leg pain and numbness. Prior
lumbar fusion.



[Series 3: l-spine 2.00 br40 s3 (person_name) · axial · 0.35mm/px · z∈[+1179,+1351]mm · 4 of 130 slices shown, 5 images]
[im 22/130  soft-tissue]
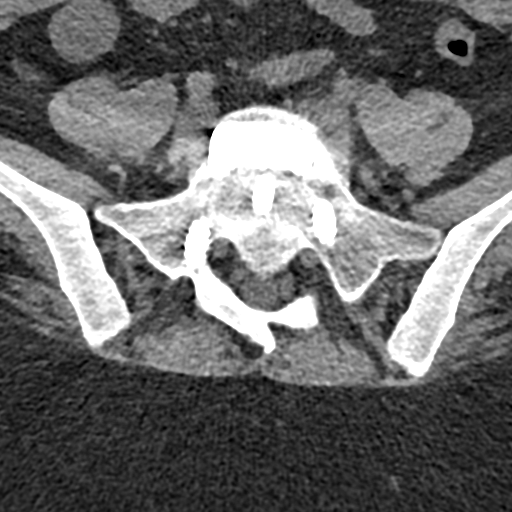
[im 22/130  bone]
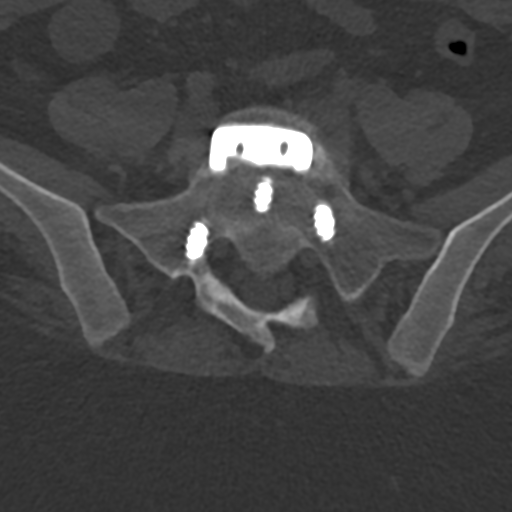
[im 44/130  bone]
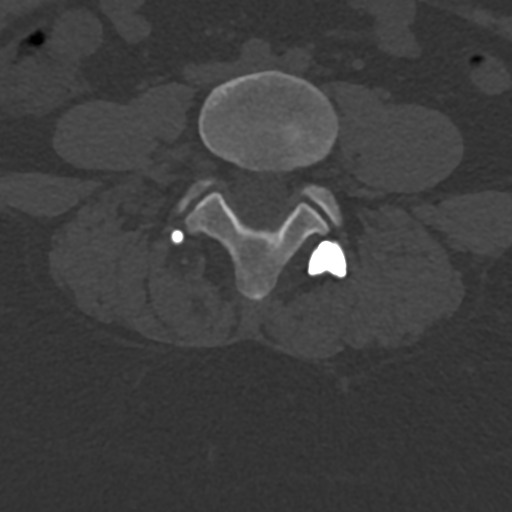
[im 87/130  bone]
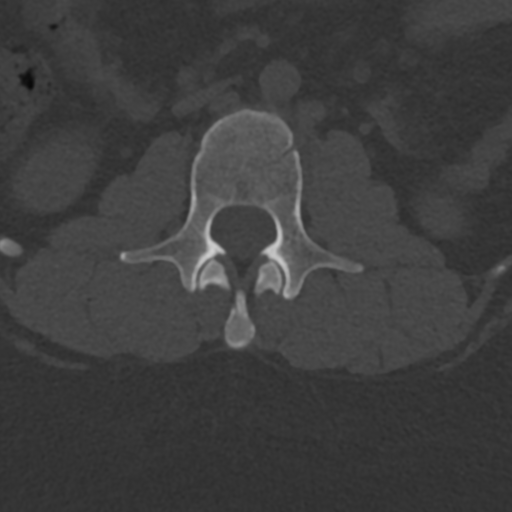
[im 108/130  bone]
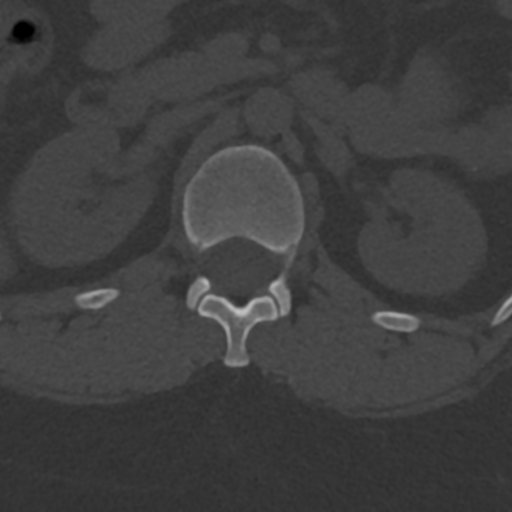

[Series 7: l-spine 2.00 br44 s3 sag soft · sagittal · 0.35mm/px · 5 of 86 slices shown, 6 images]
[im 29/86  bone]
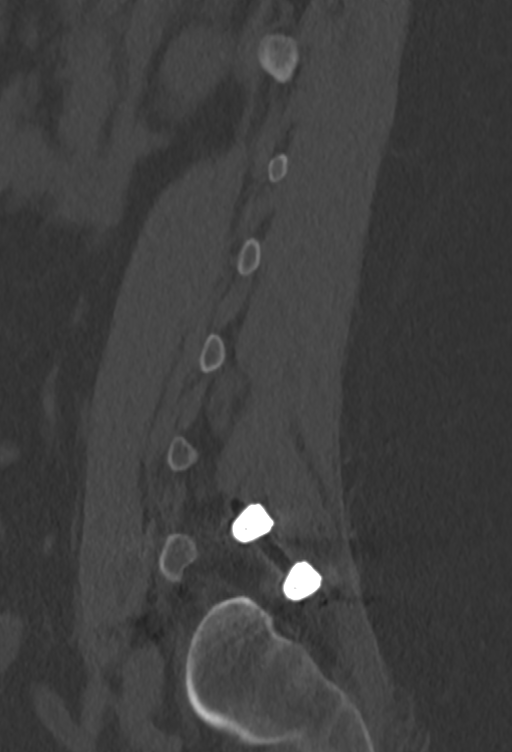
[im 36/86  bone]
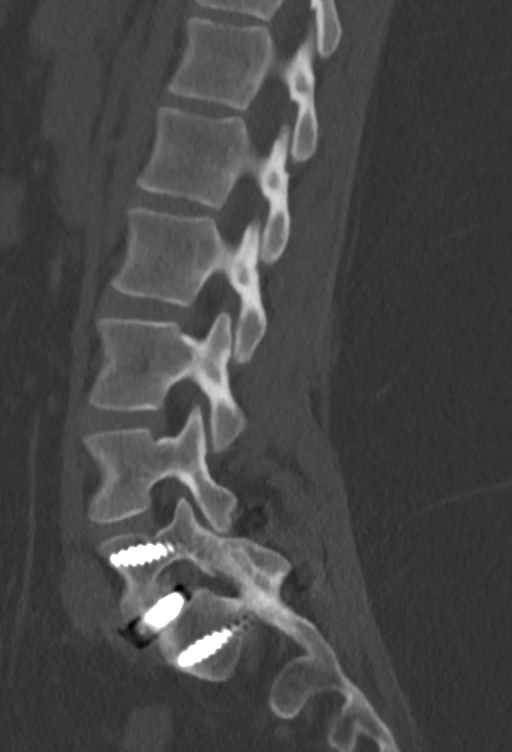
[im 43/86  soft-tissue]
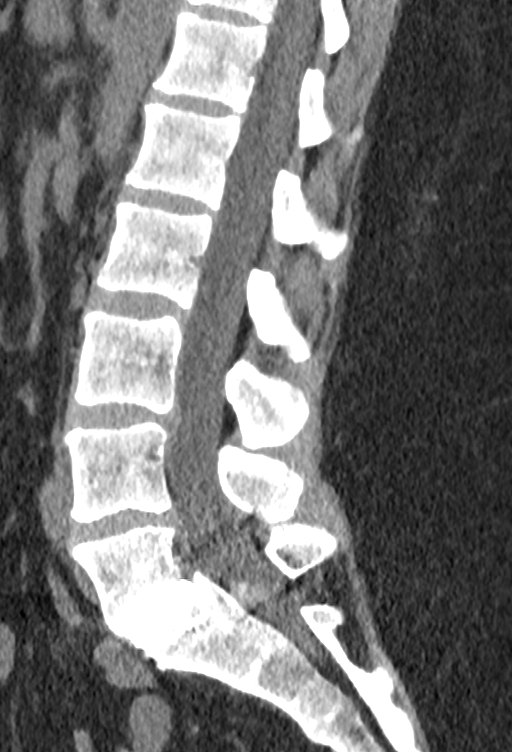
[im 43/86  bone]
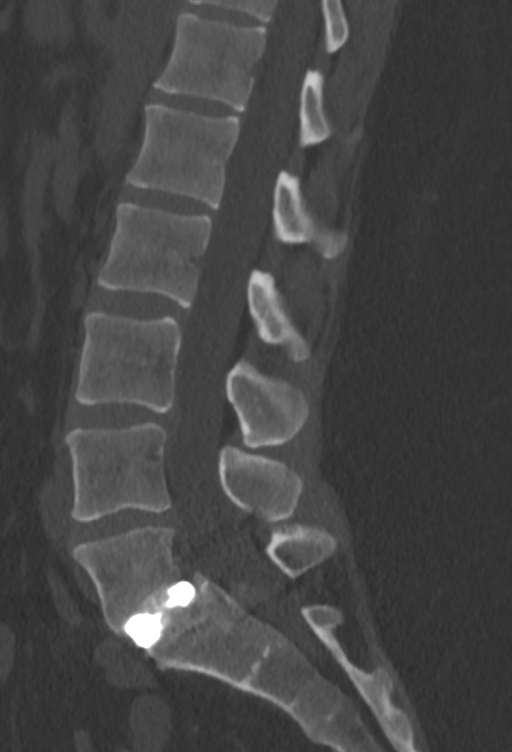
[im 50/86  bone]
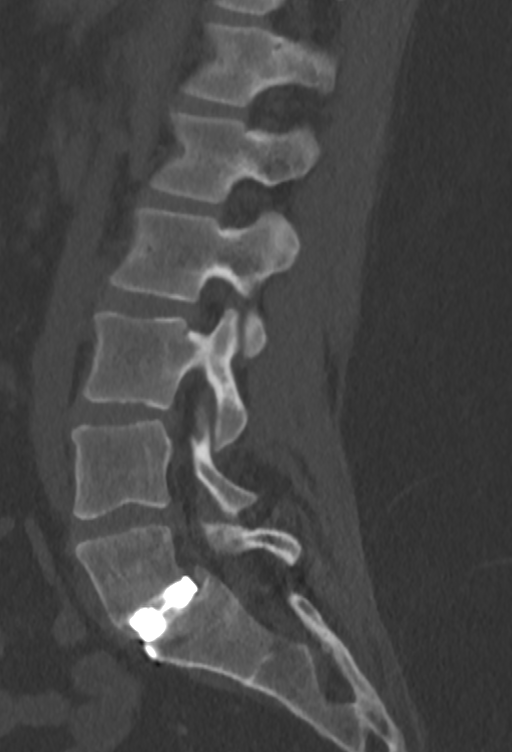
[im 57/86  bone]
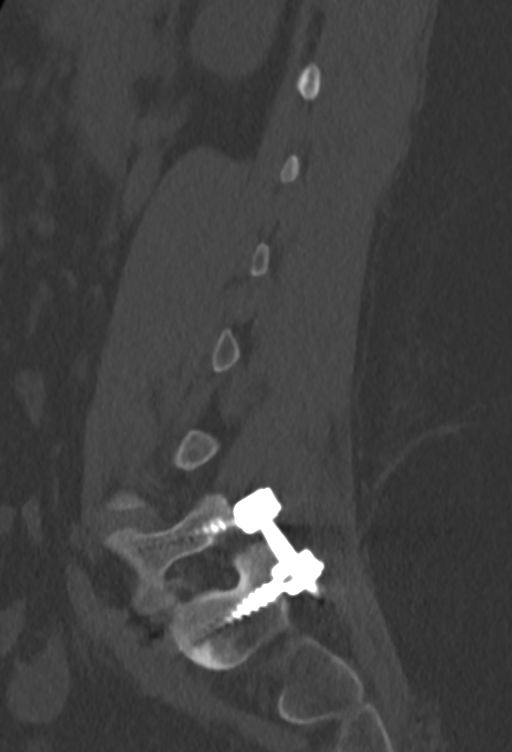

[Series 9: l-spine 2.00 br60 s3 cor bone · coronal · 0.35mm/px · 3 of 88 slices shown]
[im 18/88  bone]
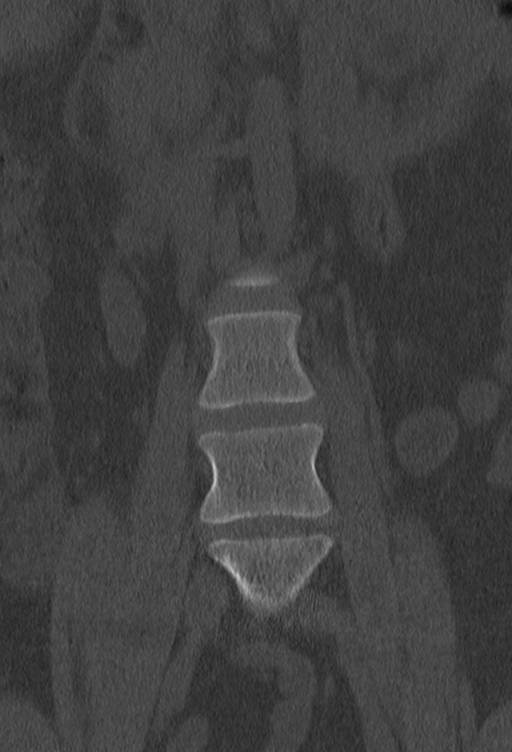
[im 35/88  bone]
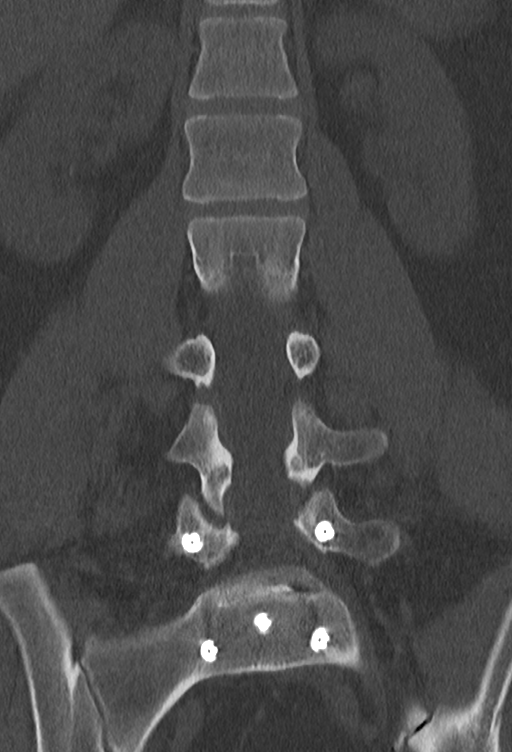
[im 53/88  bone]
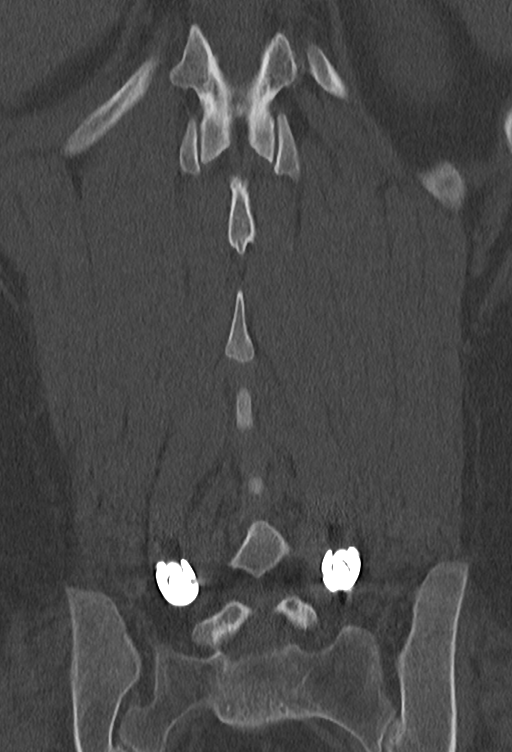

[12 of 33 positions shown; findings below may reference images not displayed]

FINDINGS: Segmentation: 5 lumbar type vertebrae.

Alignment: Fused anterolisthesis of L5 on S1 measuring 7 mm.

Vertebrae: Sequelae L5-S1 anterior interbody fusion and posterior
fusion are again identified with evidence of solid osseous fusion
across the disc space and posterior elements. No acute fracture or
suspicious osseous lesion is seen.

Paraspinal and other soft tissues: Unremarkable.

Disc levels:

T11-12 through L2-3: Negative.

L3-4: Mild disc space narrowing. Disc bulging and small central disc
protrusion without stenosis, similar to the recent prior MRI.

L4-5: Mild-to-moderate disc space narrowing. Minimal disc bulging, a
broad central disc protrusion, and mild facet hypertrophy without
evidence of significant stenosis, similar to the recent prior MRI.

L5-S1: Prior fusion.  No evidence of significant stenosis.
IMPRESSION: 1. Solid L5-S1 fusion.
2. Mild disc degeneration at L3-4 and L4-5 without evidence of
significant stenosis.

## 2023-11-30 ENCOUNTER — Other Ambulatory Visit: Payer: Self-pay | Admitting: Orthopedic Surgery

## 2023-11-30 DIAGNOSIS — M545 Low back pain, unspecified: Secondary | ICD-10-CM

## 2024-04-05 ENCOUNTER — Other Ambulatory Visit

## 2024-04-05 ENCOUNTER — Inpatient Hospital Stay: Admission: RE | Admit: 2024-04-05 | Source: Ambulatory Visit

## 2024-04-06 ENCOUNTER — Encounter: Payer: Self-pay | Admitting: Orthopedic Surgery

## 2024-04-25 ENCOUNTER — Other Ambulatory Visit: Payer: Self-pay

## 2024-04-25 ENCOUNTER — Encounter (HOSPITAL_BASED_OUTPATIENT_CLINIC_OR_DEPARTMENT_OTHER): Payer: Self-pay

## 2024-04-25 ENCOUNTER — Emergency Department (HOSPITAL_BASED_OUTPATIENT_CLINIC_OR_DEPARTMENT_OTHER): Admission: EM | Admit: 2024-04-25 | Discharge: 2024-04-25 | Disposition: A | Discharge status: Home / Self Care

## 2024-04-25 DIAGNOSIS — M549 Dorsalgia, unspecified: Secondary | ICD-10-CM | POA: Diagnosis present

## 2024-04-25 DIAGNOSIS — M5432 Sciatica, left side: Secondary | ICD-10-CM | POA: Insufficient documentation

## 2024-04-25 DIAGNOSIS — M5442 Lumbago with sciatica, left side: Secondary | ICD-10-CM | POA: Diagnosis not present

## 2024-04-25 MED ORDER — KETOROLAC TROMETHAMINE 15 MG/ML IJ SOLN
15.0000 mg | Freq: Once | INTRAMUSCULAR | Status: AC
  Administered 2024-04-25: 15 mg via INTRAMUSCULAR
  Filled 2024-04-25: qty 1

## 2024-04-25 MED ORDER — HYDROMORPHONE HCL 1 MG/ML IJ SOLN
1.0000 mg | Freq: Once | INTRAMUSCULAR | Status: AC
  Administered 2024-04-25: 1 mg via INTRAMUSCULAR
  Filled 2024-04-25: qty 1

## 2024-04-25 MED ORDER — PREDNISONE 10 MG (21) PO TBPK: ORAL_TABLET | Freq: Every day | ORAL | 0 refills | Status: AC

## 2024-04-25 NOTE — ED Triage Notes (Signed)
 Pt present via POV c/o left leg pain. Reports hx of sciatica and spinal fusion x4 hrs ago. Scheduled to have injection for same on Friday however unable to stand the pain.

## 2024-04-25 NOTE — ED Provider Notes (Signed)
 [place] EMERGENCY DEPARTMENT AT [facility] Provider Note   CSN: [id number] Arrival date & time: 04/25/24  9357     Patient presents with: Leg Pain   Laura Cooper is a 28 y.o. female.   28 year old female presents for evaluation of back pain.  Has a history of spondylolisthesis status post spinal fusion.  States she always has back pain but over the last few days it has been significantly worse.  She has had trouble sleeping and has had some numbness and burning down her entire left leg.  She states she has taken celecoxib and it has not helped.  Has taken Robaxin  and Flexeril  in the past and these have not helped either.  She had a spinal injection set up for this coming Friday.  Denies any falls or injury   Leg Pain Associated symptoms: back pain   Associated symptoms: no fever        Prior to Admission medications   Medication Sig Start Date End Date Taking? Authorizing Provider  predniSONE  (STERAPRED UNI-PAK 21 TAB) 10 MG (21) TBPK tablet Take by mouth daily. Take 6 tabs by mouth daily  for 2 days, then 5 tabs for 2 days, then 4 tabs for 2 days, then 3 tabs for 2 days, 2 tabs for 2 days, then 1 tab by mouth daily for 2 days 04/25/24  Yes Collen Hostler L, DO  DULoxetine  (CYMBALTA ) 30 MG capsule Take 60 mg by mouth daily.  07/31/19   [provider]  medroxyPROGESTERone (DEPO-PROVERA) 150 MG/ML injection Inject 150 mg into the muscle every 3 (three) months.    [provider]  methocarbamol  (ROBAXIN ) 500 MG tablet Take 1 tablet (500 mg total) by mouth every 6 (six) hours as needed for muscle spasms. 10/26/19   Beuford Anes, MD  oxyCODONE -acetaminophen  (PERCOCET/ROXICET) 5-325 MG tablet Take 1-2 tablets by mouth every 4 (four) hours as needed for moderate pain or severe pain. 10/26/19   Beuford Anes, MD    Allergies: Patient has no known allergies.    Review of Systems  Constitutional:  Negative for chills and fever.  HENT:  Negative for  ear pain and sore throat.   Eyes:  Negative for pain and visual disturbance.  Respiratory:  Negative for cough and shortness of breath.   Cardiovascular:  Negative for chest pain and palpitations.  Gastrointestinal:  Negative for abdominal pain and vomiting.  Genitourinary:  Negative for dysuria and hematuria.  Musculoskeletal:  Positive for back pain. Negative for arthralgias.  Skin:  Negative for color change and rash.  Neurological:  Negative for seizures and syncope.  All other systems reviewed and are negative.   Updated Vital Signs BP 119/89   Pulse 69   Temp 98.4 F (36.9 C) (Oral)   Resp 20   SpO2 100%   Physical Exam Vitals and nursing note reviewed.  Constitutional:      General: She is not in acute distress.    Appearance: Normal appearance. She is well-developed. She is not ill-appearing.  HENT:     Head: Normocephalic and atraumatic.  Eyes:     Conjunctiva/sclera: Conjunctivae normal.  Cardiovascular:     Rate and Rhythm: Normal rate and regular rhythm.     Heart sounds: No murmur heard. Pulmonary:     Effort: Pulmonary effort is normal. No respiratory distress.     Breath sounds: Normal breath sounds.  Abdominal:     Palpations: Abdomen is soft.     Tenderness: There is no  abdominal tenderness.  Musculoskeletal:        General: No swelling.     Cervical back: Neck supple.     Comments: +2 dorsalis pedis pulses bilaterally  Skin:    General: Skin is warm and dry.     Capillary Refill: Capillary refill takes less than 2 seconds.  Neurological:     Mental Status: She is alert.  Psychiatric:        Mood and Affect: Mood normal.     (all labs ordered are listed, but only abnormal results are displayed) Labs Reviewed - No data to display  EKG: None  Radiology: No results found.   Procedures   Medications Ordered in the ED  HYDROmorphone  (DILAUDID ) injection 1 mg (has no administration in time range)  ketorolac  (TORADOL ) 15 MG/ML injection 15  mg (has no administration in time range)                                    Medical Decision Making Patient here for sciatica.  Will give her a shot of Dilaudid  and Toradol  IM in the ER.  Will give her prescription for steroid taper and she is advised Tylenol  Motrin as needed for pain.  She has an appointment for a spinal injection with pain management on Friday and I advised her to go to this appointment.  Advised to return to the ER for new or worsening symptoms.  She feels comfortable being discharged home.  Problems Addressed: Sciatica of left side: acute illness or injury  Amount and/or Complexity of Data Reviewed External Data Reviewed: notes.    Details: Previous hospital records reviewed and patient had spine surgery in 2021  Risk OTC drugs. Prescription drug management. Parenteral controlled substances.    Final diagnoses:  Sciatica of left side    ED Discharge Orders          Ordered    predniSONE  (STERAPRED UNI-PAK 21 TAB) 10 MG (21) TBPK tablet  Daily        04/25/24 0710               Gennaro Duwaine CROME, DO 04/25/24 [phone]

## 2024-04-25 NOTE — ED Notes (Signed)
Discharge instructions, follow up care, and prescription reviewed and explained, pt verbalized understanding and had no further questions on d/c.  

## 2024-04-25 NOTE — Discharge Instructions (Signed)
 Take your steroids as prescribed.  You can also use Tylenol  as needed for pain throughout the day.  Make sure you go to your pain management appointment on Friday.

## 2024-04-28 ENCOUNTER — Ambulatory Visit
Admission: RE | Admit: 2024-04-28 | Discharge: 2024-04-28 | Disposition: A | Discharge status: Home / Self Care | Payer: Self-pay | Source: Ambulatory Visit | Attending: Orthopedic Surgery | Admitting: Orthopedic Surgery

## 2024-04-28 DIAGNOSIS — M545 Low back pain, unspecified: Secondary | ICD-10-CM
